# Patient Record
Sex: Female | Born: 1935 | Race: White | Hispanic: No | Smoking: Former smoker
Health system: Southern US, Community
[De-identification: ages and names within clinical notes are randomized; demographics above are authoritative.]

## PROBLEM LIST (undated history)

## (undated) DIAGNOSIS — F32A Depression, unspecified: Secondary | ICD-10-CM

## (undated) DIAGNOSIS — G20A1 Parkinson's disease without dyskinesia, without mention of fluctuations: Secondary | ICD-10-CM

## (undated) DIAGNOSIS — M1990 Unspecified osteoarthritis, unspecified site: Secondary | ICD-10-CM

## (undated) DIAGNOSIS — I10 Essential (primary) hypertension: Secondary | ICD-10-CM

## (undated) DIAGNOSIS — F0280 Dementia in other diseases classified elsewhere without behavioral disturbance: Secondary | ICD-10-CM

## (undated) DIAGNOSIS — G309 Alzheimer's disease, unspecified: Secondary | ICD-10-CM

## (undated) DIAGNOSIS — M109 Gout, unspecified: Secondary | ICD-10-CM

## (undated) DIAGNOSIS — E785 Hyperlipidemia, unspecified: Secondary | ICD-10-CM

## (undated) DIAGNOSIS — G629 Polyneuropathy, unspecified: Secondary | ICD-10-CM

## (undated) DIAGNOSIS — G4700 Insomnia, unspecified: Secondary | ICD-10-CM

## (undated) DIAGNOSIS — H9190 Unspecified hearing loss, unspecified ear: Secondary | ICD-10-CM

## (undated) DIAGNOSIS — G20 Parkinson's disease: Secondary | ICD-10-CM

## (undated) DIAGNOSIS — K5900 Constipation, unspecified: Secondary | ICD-10-CM

## (undated) DIAGNOSIS — F329 Major depressive disorder, single episode, unspecified: Secondary | ICD-10-CM

## (undated) DIAGNOSIS — M549 Dorsalgia, unspecified: Secondary | ICD-10-CM

## (undated) HISTORY — PX: TOTAL KNEE ARTHROPLASTY: SHX125

---

## 2002-07-31 ENCOUNTER — Encounter: Admission: RE | Admit: 2002-07-31 | Discharge: 2002-09-29 | Payer: Self-pay

## 2006-02-14 ENCOUNTER — Ambulatory Visit (HOSPITAL_COMMUNITY): Admission: RE | Admit: 2006-02-14 | Discharge: 2006-02-14 | Payer: Self-pay | Admitting: *Deleted

## 2006-04-29 ENCOUNTER — Inpatient Hospital Stay (HOSPITAL_COMMUNITY): Admission: EM | Admit: 2006-04-29 | Discharge: 2006-05-03 | Payer: Self-pay | Admitting: Emergency Medicine

## 2006-05-18 ENCOUNTER — Emergency Department (HOSPITAL_COMMUNITY): Admission: EM | Admit: 2006-05-18 | Discharge: 2006-05-18 | Payer: Self-pay | Admitting: Emergency Medicine

## 2006-05-30 ENCOUNTER — Inpatient Hospital Stay (HOSPITAL_COMMUNITY): Admission: RE | Admit: 2006-05-30 | Discharge: 2006-06-03 | Payer: Self-pay | Admitting: Neurological Surgery

## 2006-07-29 ENCOUNTER — Ambulatory Visit: Payer: Self-pay | Admitting: Gastroenterology

## 2006-08-28 ENCOUNTER — Ambulatory Visit (HOSPITAL_COMMUNITY): Admission: RE | Admit: 2006-08-28 | Discharge: 2006-08-28 | Payer: Self-pay | Admitting: Neurological Surgery

## 2006-10-13 ENCOUNTER — Emergency Department (HOSPITAL_COMMUNITY): Admission: EM | Admit: 2006-10-13 | Discharge: 2006-10-13 | Payer: Self-pay | Admitting: Emergency Medicine

## 2006-10-30 ENCOUNTER — Ambulatory Visit: Payer: Self-pay | Admitting: Gastroenterology

## 2007-04-21 ENCOUNTER — Emergency Department (HOSPITAL_COMMUNITY): Admission: EM | Admit: 2007-04-21 | Discharge: 2007-04-21 | Payer: Self-pay | Admitting: Emergency Medicine

## 2007-05-01 ENCOUNTER — Observation Stay (HOSPITAL_COMMUNITY): Admission: EM | Admit: 2007-05-01 | Discharge: 2007-05-07 | Payer: Self-pay | Admitting: Emergency Medicine

## 2007-05-10 ENCOUNTER — Emergency Department (HOSPITAL_COMMUNITY): Admission: EM | Admit: 2007-05-10 | Discharge: 2007-05-11 | Payer: Self-pay | Admitting: Emergency Medicine

## 2007-05-22 ENCOUNTER — Emergency Department (HOSPITAL_COMMUNITY): Admission: EM | Admit: 2007-05-22 | Discharge: 2007-05-22 | Payer: Self-pay | Admitting: Emergency Medicine

## 2007-09-17 ENCOUNTER — Emergency Department (HOSPITAL_COMMUNITY): Admission: EM | Admit: 2007-09-17 | Discharge: 2007-09-17 | Payer: Self-pay | Admitting: Emergency Medicine

## 2007-12-25 ENCOUNTER — Emergency Department (HOSPITAL_COMMUNITY): Admission: EM | Admit: 2007-12-25 | Discharge: 2007-12-25 | Payer: Self-pay | Admitting: Emergency Medicine

## 2008-04-06 ENCOUNTER — Encounter: Admission: RE | Admit: 2008-04-06 | Discharge: 2008-04-06 | Payer: Self-pay | Admitting: Internal Medicine

## 2008-04-08 ENCOUNTER — Ambulatory Visit (HOSPITAL_COMMUNITY): Admission: RE | Admit: 2008-04-08 | Discharge: 2008-04-08 | Payer: Self-pay | Admitting: Internal Medicine

## 2008-04-25 ENCOUNTER — Emergency Department (HOSPITAL_COMMUNITY): Admission: EM | Admit: 2008-04-25 | Discharge: 2008-04-25 | Payer: Self-pay | Admitting: Emergency Medicine

## 2008-05-16 ENCOUNTER — Emergency Department (HOSPITAL_COMMUNITY): Admission: EM | Admit: 2008-05-16 | Discharge: 2008-05-16 | Payer: Self-pay | Admitting: Emergency Medicine

## 2011-05-15 NOTE — Discharge Summary (Signed)
Jenna Barber, Jenna Barber                   ACCOUNT NO.:  [id number]   MEDICAL RECORD NO.:  [id number]          PATIENT TYPE:  INP   LOCATION:  6729                         FACILITY:  [facility]   PHYSICIAN:  Altha Harm, MDDATE OF BIRTH:  [birth year]   DATE OF ADMISSION:  04/30/2007  DATE OF DISCHARGE:  05/07/2007                               DISCHARGE SUMMARY   DISPOSITION:  [facility].   DISCHARGE DIAGNOSES:  1. Cogentin-induced delirium and psychosis, now resolved.  2. Constipation.  3. Chronic pain.  4. Parkinsonism.  5. Gait disturbance.  6. Acute sinusitis.  7. History of depression.  8. Hypokalemia.  9. Osteoporosis.  10.Gastroesophageal reflux disease.   DISCHARGE MEDICATIONS:  1. Cymbalta 60 mg p.o. daily.  2. Allegra D 60/120 one tablet p.o. b.i.d. x3 days.  3. Fentanyl 12 mcg transdermal q. 72 h.  4. Multivitamins 1 tablet p.o. daily.  5. Calcium 500 tablets p.o. b.i.d.  6. Protonix 40 mg p.o. daily.  7. MiraLax 17 g in 8 ounces of water p.o. daily.  8. Colace 100 mg p.o. daily.  9. Potassium 20 mEq p.o. daily.  10.Senokot 1 tablet p.o. daily.  11.Ultram 75 mg p.o. q. 8 h p.r.n. pain.  12.Vitamin D 400 mg p.o. daily.  13.Azithromycin 250 mg p.o. daily x4 days.   CONSULTATIONS:  1. Antonietta Breach, M.D., psychiatry.  2. Bevelyn Buckles. Nash Shearer, M.D., neurology.   PROCEDURES:  None.   DIAGNOSTIC STUDIES:  CT head without contrast shows no acute  intracranial abnormalities.  The patient has brain atrophy and chronic  small vessel disease.   CHIEF COMPLAINT:  Patient seeing things.   HISTORY OF PRESENT ILLNESS:  Please see dictated H&P by Dr. Lavera Guise for  details of the HPI.   HOSPITAL COURSE:  #1.  DELIRIUM:  The patient was admitted with delirium and continued to  have delirium albeit getting progressively better over the course of her  hospital stay.  By the end of her hospitalization, the patient's  delirium had resolved.  The delirium  was felt to be secondary to  Cogentin that had been prescribed for parkinsonism.  This was also  discontinued at the onset of her admission.  Neurology was consulted for  recommendations for a substitute medication.  It was felt at this time  there was not enough reason to warrant treating the patient for  parkinsonism.   #2.  GAIT DISTURBANCE:  The patient apparently had been ambulating prior  to admission to the hospital.  However upon evaluation during the  hospitalization, the patient was found to require maximum assistance  from 2 people for ambulation and transfers on a level surface.  Physical  therapy was working with the patient, and the patient is to continue  physical therapy at the skilled nursing facility.   #3.  CONSTIPATION:  The patient apparently has a history of chronic  constipation, and  during her hospitalization continued to have  constipation.  The patient was treated with her usual MiraLax, Senokot,  and a stool softener.  The patient is to continue  on such.   #4.  ACUTE SINUSITIS:  The patient complained of headache and had  frontal sinus tenderness.  No CT scan of the sinuses specifically done,  but the patient was treated empirically with a decongestant and  azithromycin for sinusitis.  The patient is to complete a course of  azithromycin 250 mg p.o. daily x4 days.   All other medical problems remained stable, and the patient is being  discharged on the above-stated medications.   CONDITION ON DISCHARGE:  Stable.  Dietary restrictions:  None.  Physical  restrictions:  The patient should be up with assistance and as directed  by physical therapy.  The patient is to follow up with her neurologist  in about 2-3 weeks for further evaluation at that time.      Altha Harm, MD  Electronically Signed     MAM/MEDQ  D:  05/06/2007  T:  05/06/2007  Job:  [id number]   cc:   Bertram Millard. Hyacinth Meeker, M.D.

## 2011-05-15 NOTE — Consult Note (Signed)
Jenna Barber, Jenna Barber                   ACCOUNT NO.:  [id number]   MEDICAL RECORD NO.:  [id number]          PATIENT TYPE:  INP   LOCATION:  6729                         FACILITY:  [facility]   PHYSICIAN:  Gustavus Messing. Orlin Hilding, M.D.DATE OF BIRTH:  1929/09/13   DATE OF CONSULTATION:  05/05/2007  DATE OF DISCHARGE:                                 CONSULTATION   REASON FOR CONSULTATION:  Movement disorder.  Recommend medication.   HISTORY OF PRESENT ILLNESS:  [name] is a 77 year old woman with a  history of severe cervical myelopathy who had been seen by Dr. Anne Hahn in  November of 2007 and again in April of 2008 for a gait disorder which he  felt was related to her cervical myelopathy and some tardive dyskinesias  related to withdrawal of metoclopramide.  It sounds from his note that  he was sent over with a question of Parkinson's.  He felt that there was  not evidence of Parkinson's, but that this was due to severe cervical  myelopathy.  He also made mention that she had a tardive dyskinesia upon  cessation of metoclopramide.  He had recommended when he saw her in  April a trial of Cogentin.  This was initiated after the April 08, 2007,  visit.  She was admitted on May 03, 2007 with what appeared to be a  psychosis with hallucinations and confusion.  She had both visual and  auditory hallucinations, and some agitation, which developed at her  residence which is an independent assisted level living at the [facility].  Since the Cogentin has been discontinued, she has been gradually  improving.  A CT scan of the brain was done on admission that was  negative for anything acute.  She has been seen by Dr. Jeanie Sewer of  psychiatry.   REVIEW OF SYSTEMS:  Out of a comprehensive 12-system review, including  cardiovascular, pulmonary, neurologic, hematologic, endocrine, GI, GU,  musculoskeletal, ENT, reproductive, skin, mucosa, pain, and nutrition,  earmarked as normal except for confusion and  neuropathy pain, impaired  memory, incontinence, some chronic right hip pain.   PAST MEDICAL HISTORY:  Significant for hypertension, migraines,  peripheral neuropathy, severe gait disturbance related to cervical  myelopathy.  Cervical spine surgery December of 2006.  Tonsillectomy,  bilateral knee replacement, lumbosacral spine surgery, left rotator cuff  surgery, osteoarthritis, appendectomy, some rotator cuff repairs, and  has had a splenectomy.   MEDICATIONS:  Prior to admission included Cogentin 1 mg twice a day, a  fentanyl patch, Cymbalta, glucosamine, calcium, Protonix, Miacalcin I  think, Colace, potassium, Senokot, tramadol, and vitamin D.  Currently,  her medicines include Lovenox, Protonix, Senokot, Duragesic patch,  Cymbalta, calcium carbonate, MiraLax, Tylenol, Dulcolax, Ultram, Haldol.   ALLERGIES LISTED:  INCLUDE NONSTEROIDALS, CYCLOOXYGENASE-2 OR COX-2  INHIBITORS, CLINDAMYCIN, CODEINE, METHADONE, PENICILLIN, SULFA,  TETRACYCLINE.   SOCIAL HISTORY:  She is divorced.  Retired Nature conservation officer.  Living  at [facility].  She has 2 children.  No alcohol or illegal drugs.   FAMILY HISTORY:  Not useful.   OBJECTIVE ON EXAM:  VITAL SIGNS:  Temperature is 98.2, pulse 80,  respirations 20, blood pressure 125/74, she is 95% sat on room air.  HEAD:  Normocephalic, atraumatic.  She is awake, alert, appropriate.  She scores 30 out of 30 on the mini mental status exam.  Cranial nerves  are normal.  She is partially edentulous with no upper teeth and partial  lower teeth.  She had some mouth movements which could be tardive  dyskinesia or Arbuckle dyskinesia versus a habit type of tic due to her  having jaw pain and being edentulous.  She appeared as though she was  exploring a sore tooth with her tongue.  However, cranial nerves are  normal.  Visual fields are full.  Extraocular movements are intact.  Facial sensation is normal.  Facial motor activity is normal.  Hearing   is symmetric.  Palate is symmetric.  Tongue is midline.  MOTOR EXAM:  I did not get her up.  She has a known gait disorder  presumed secondary to significant cervical myelopathy with multiple  falls.  However, she has no tremor.  She has mild increased tone  diffusely, but no cogwheeling.  She has good strength.  No drift.  Tendon reflexes are brisk in the upper and lower extremities with cross  conductors, but she has down-going toes.  Coordination and sensory are  unremarkable.  CT of the head was negative for anything acute.  Does  show atrophy and small vessel disease.   IMPRESSION:  History of tardive dyskinesia secondary to withdrawal from  metoclopramide.  No evidence of Parkinson's disease.  Minimal findings  on exam now.  She is intolerant to Cogentin, this apparently causes a  psychosis.   RECOMMENDATIONS:  Agree with discontinuation of Cogentin.  I do not see  enough of a movement disorder to treat at present.  She should follow up  with Dr. Anne Hahn in 3 to 4 weeks.      [name], M.D.  Electronically Signed     CAW/MEDQ  D:  05/05/2007  T:  05/05/2007  Job:  [id number]

## 2011-05-15 NOTE — Consult Note (Signed)
[name], Barber                   ACCOUNT NO.:  [id number]   MEDICAL RECORD NO.:  [id number]          PATIENT TYPE:  INP   LOCATION:  6729                         FACILITY:  [facility]   PHYSICIAN:  Antonietta Breach, M.D.  DATE OF BIRTH:  May 09, 1929   DATE OF CONSULTATION:  05/01/2007  DATE OF DISCHARGE:                                 CONSULTATION   REQUESTING PHYSICIAN:  Michelle A. Ashley Royalty, MD, of [facility].   REASON FOR CONSULTATION:  Hallucinations, bizarre behavior and  agitation.   [name] is a 78 year old female admitted to the [facility] on  April 30, 2007, after mental status changes.   Mrs. Jenna Barber has been having an onset of mental status changes over the  past day prior to admission.  She has been having auditory and visual  hallucinations.  She has been severely agitated as well as confused and  disoriented.   She required Haldol 5 mg at 3:30 this a.m.  Her agitation is less but  she does continue with hallucinations and mild confusion.   PAST PSYCHIATRIC HISTORY:  The patient does have a history of anxiety  and depression in the past.  She was treated with Lexapro in August  2007.  This was discontinued and she was started on Cymbalta in May  2007.   She has continued as an outpatient on Cymbalta 60 mg daily with good  results except for residual decreased energy.   FAMILY PSYCHIATRIC HISTORY:  None known.   SOCIAL HISTORY:  Marital status:  [name] is divorced.  Occupation:  Retired Scientist, clinical (histocompatibility and immunogenetics).  She does not use alcohol or illegal drugs.  She lives  at the [facility].  She has two children.  Her religion is  [place].   GENERAL MEDICAL PROBLEMS:  1. Degenerative spine disease.  2. Cervical surgery history.  3. Bilateral total knee replacements.  4. Rotator cuff repairs.  5. Status post splenectomy.  6. Peripheral neuropathy.   ALLERGIES:  NONSTEROIDAL ANTI-INFLAMMATORY DRUGS, CELEBREX, CLINDAMYCIN,  CODEINE, METHADONE, PENICILLIN, SULFA,  TETRACYCLINE.  Marland Kitchen   LABORATORY DATA:  WBC 6.5, hemoglobin 13, platelets 187.  BUN 15,  creatinine 0.79, SGOT 22, SGPT 14, albumin 3.3.  ammonia 19.  TSH within  normal limits with 1.188.  B12 within normal limits at 488.  RPR  nonreactive.  A head CT without contrast showed no acute abnormalities.  There is atrophy and chronic small-vessel disease.   MEDICATIONS:  MAR is reviewed.  The patient is on Cymbalta 60 mg daily,  Ultram 25 mg q.8h. p.r.n.   REVIEW OF SYSTEMS:  CONSTITUTIONAL:  Afebrile.  HEAD:  No trauma.  EYES:  No visual changes.  EARS:  No hearing impairment.  NOSE:  No rhinorrhea.  MOUTH/THROAT:  No sore throat.  NEUROLOGIC:  The the patient does have a  history of provisional parkinsonian tremor for which she was prescribed  Cogentin 1 mg b.i.d.  MUSCULOSKELETAL:  With the patient's severe  arthritis, she also has been taking fentanyl 12 mcg patch at the  [facility] as well as tramadol  p.r.n.  PSYCHIATRIC:  As above.  CARDIOVASCULAR:  No chest pain.  RESPIRATORY:  No cough.  GASTROINTESTINAL:  No nausea, vomiting, diarrhea.  GENITOURINARY:  No  dysuria.  SKIN:  Unremarkable.  MUSCULOSKELETAL:  As above.  HEMATOLOGIC/LYMPHATIC:  Unremarkable.  ENDOCRINE/METABOLIC:  Unremarkable.   EXAMINATION:  VITAL SIGNS:  Temperature 99.0, pulse 95, respirations 16,  blood pressure 141/80, O2 saturation on room air 95%.  MENTAL STATUS EXAM:  [name] is an elderly female partially  reclined in a supine position, appearing her chronologic age of 47 with  good eye contact.  She is well-groomed and cooperative with the  interview.  On orientation testing, she knows the year.  She also states  that the month is the fifth month.  It takes her awhile to remember the  name of the month.  She does not know the day of the month and she  states that she is at church.  Memory 3/3 immediate and 1/3 at 5  minutes.  Fund of knowledge and intelligence are below that of her  estimated  premorbid baseline.  Speech involves normal rate and prosody  without dysarthria.  Thought process involves partial confabulation.  Thought content:  Visual hallucinations, which the patient is amused  with.  No thoughts of harming herself, no thoughts of harming others.  No delusions.  Affect is involving a slightly inappropriate euphoric  affect at times.  However, the patient does have partial insight in that  she knows that she is having mental abnormalities.  Her judgment is  still impaired.   ASSESSMENT:  Axis I:  293.00, delirium not otherwise specified.  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  Other factors that  possibly are in play include a fentanyl patch at the assisted living  facility as well as possibly tramadol.  293.84, anxiety disorder not  otherwise specified, stable.  Depressive disorder not otherwise  specified, stable.  Axis II:  None.  Axis III:  See general medical problems.  Axis IV:  General medical.  Axis V:  40.   RECOMMENDATIONS:  1. Will check an EKG for the QTC.  If the QTC is less than 500 and the      hallucinations and confusion continue into the morning, would start      Haldol at 0.5 mg b.i.d. for antipsychosis and antiagitation.  2. Concur with the discontinuation of Cogentin.  Also concur with      reducing narcotics as much as possible.      Antonietta Breach, M.D.  Electronically Signed     JW/MEDQ  D:  05/01/2007  T:  05/02/2007  Job:  [id number]

## 2011-05-15 NOTE — H&P (Signed)
Jenna Barber, TISSUE                   ACCOUNT NO.:  [id number]   MEDICAL RECORD NO.:  [id number]          PATIENT TYPE:  INP   LOCATION:  6729                         FACILITY:  [facility]   PHYSICIAN:  Lonia Blood, M.D.       DATE OF BIRTH:  July 10, 1929   DATE OF ADMISSION:  05/01/2007  DATE OF DISCHARGE:                              HISTORY & PHYSICAL   PRIMARY CARE PHYSICIAN:  Bertram Millard. Hyacinth Meeker, M.D.   CHIEF COMPLAINTS:  Seeing things.   HISTORY OF PRESENT ILLNESS:  Mrs. Ramer is a 77 year old woman with a  history of osteoarthritis and spinal stenosis who has been residing at  the [facility].  The patient was sent today via ambulance to the  emergency room after she started acting weirdly, seeing things, hearing  voices and becoming agitated.  The patient, herself, right now does not  know where she is and does not know why she was brought here.  She would  just like to go home.   PAST MEDICAL HISTORY:  1. Degenerative spine disease.  2. Cervical surgery.  3. Bilateral total knee replacements.  4. Rotator cuff repairs.  5. Status post splenectomy.  6. Peripheral neuropathy.   MEDICATIONS AT THE ASSISTED LIVING FACILITY:  1. Cogentin 1 mg by mouth twice a day.  2. Fentanyl patch 12 mcg every 3 days.  3. Multivitamin daily.  4. Cymbalta 60 mg daily.  5. Glucosamine 500 mg daily.  6. Calcium 500 mg 2 tablets twice daily.  7. Protonix 40 mg daily.  8. MiraLax 17 grams daily.  9. Colace 100 mg daily.  10.Potassium 20 mEq daily.  11.Senokot one tablet daily.  12.Tramadol 75 mg as needed for pain.  13.Vitamin D 400 mg daily.   SOCIAL HISTORY:  The patient is a widow.  She has 2 grown children.  She  used to work as a Nature conservation officer.  She has a remote history of  tobacco use.  She does not drink any alcohol.  The patient is a do not  resuscitate status.   FAMILY HISTORY:  Reviewed and is noncontributory secondary to the  patient's age.   REVIEW OF SYSTEMS:  The patient  denies chest pain, denies shortness of  breath, denies nausea, vomiting or abdominal pain.   PHYSICAL EXAMINATION UPON ADMISSION:  VITAL SIGNS:  Temperature of 98.9,  blood pressure 139/80, pulse 80, respirations 16, saturation 98% on room  air.  GENERAL APPEARANCE:  Obese woman in no acute distress lying on the bed,  alert and somewhat oriented.  The patient has periods of extreme clarity  fluctuating with periods of utter confusion.  The patient is relating  seeing persons in the room that are not there and imagining things and  at the end of the hallway seeing things.  HEENT:  The head appears normocephalic and atraumatic.  Eyes - pupils  equal, round, reactive to light and accommodation.  Extraocular  movements intact.  CHEST:  Clear.  NECK:  Supple.  No JVD.  ABDOMEN:  Soft, nontender.  Bowel sounds are present.  LOWER EXTREMITIES:  No edema.  NEUROLOGIC:  Nonfocal.  The patient's short-term memory - out of 3  objects, she can repeat, and then at 3 minutes she can recall only 1 of  the 3 objects.   LABORATORY VALUES:  Lab values at the time of admission show a sodium of  138, potassium 3.9, chloride 108, BUN 18, creatinine 96, glucose is 96,  creatinine is 1.0.  White blood cell count 6.1, hemoglobin 13, platelet  count is 187.  Urinalysis is positive for some leukocyte esterase but  rare bacteria, white blood cells or red blood cells.   ASSESSMENT/PLAN:  1. Acute psychosis.  I do suspect this is related to the use of      Cogentin.  The patient will be taken off of Cogentin, and her      status will be closely monitored.  2. Inability to walk due to lumbar spine stenosis.  The patient will      continue physical therapy and occupational therapy here in the      hospital.      Lonia Blood, M.D.  Electronically Signed     SL/MEDQ  D:  05/01/2007  T:  05/01/2007  Job:  [id number]   cc:   Bertram Millard. Hyacinth Meeker, M.D.

## 2011-05-18 NOTE — Consult Note (Signed)
NAME:  Jenna Barber, Jenna Barber                             ACCOUNT NO.:  [id number]   MEDICAL RECORD NO.:  [id number]                   PATIENT TYPE:  REC   LOCATION:  TPC                                  FACILITY:  [facility]   PHYSICIAN:  Hans C. Stevphen Rochester, M.D.                DATE OF BIRTH:  Mar 19, 1929   DATE OF CONSULTATION:  DATE OF DISCHARGE:                           PAIN MANAGEMENT CONSULTATION   SUBJECTIVE:  Patient comes in for pain management today as a kind referral  from Dr. Marlynn Perking.  She is a pleasant 77 year old who has had declining  function indices and quality of life indices secondary to paralumbar  discomfort.  Notes I reviewed include an MRI report, and previous  interventional procedures performed by [facility] anesthesiology and pain.  Relating her pain as an 8/10 on a subjective scale, a dull ache and  throbbing, poor restorative sleep capacity and aching all over the  paralumbar region.  She was apparently also diagnosed with fibromyalgia but  probably more fibromyalgia in presentation as she does not have significant  suprascapular, perithoracic or paracervical pain component.  The injections  helped her.  They were fleeting in nature lasting up to only a month.  She  did not improve functional indices and quality of life indices after these  injections.  She relates no specific injury.  She relates her pain is  constant, aching and sharp.  Improved with rest, heat and medication.  Made  worse by walking, bending and sitting.  MRI reviewed which reveals  degenerative components in multiple levels with spondylolisthesis at 5 mm  L5/S1 and facet overlay.   MEDICATIONS:  Naproxen, spironolactone, hydrocodone, Risperdal, furosemide,  vitamin E, Centrum.  She was prescribed methadone but did not take it  secondary to dysphoria.   ALLERGIES:  Sulfa, penicillin, tetracycline, Codeine, clindamycin, Celebrex,  Vioxx, Voltaren and Mobic.   States no wish to harm self or others.   Fourteen point review of systems,  health and history form reviewed.   PAST SURGICAL HISTORY:  Total knee replacement in 1994 on the left side,  left shoulder rotator cuff repair, T&A at 40, appendectomy at 50 and Baker's  cyst in 1994.  She also relates heart disease with frequent PVCs.  Denies  hypertension, stroke, diabetes, cancer.   FAMILY HISTORY:  Remarkable for her heart disease, diabetes and  hypertension.  She is a retired Programmer, systems by trade.  She is a nonsmoker.  Nondrinker.  She is divorced.   Review of systems, family and social history otherwise noncontributory to  her pain problem.   PHYSICAL EXAMINATION:  GENERAL:  A pleasant female sitting comfortably in  bed.  Gait with __________ assistant device.  Affect, appearance is normal.  Oriented x 3.  HEENT:  Unremarkable.  CHEST:  Clear to auscultation and percussion.  HEART:  Regular rate and rhythm without murmur, rub or gallop.  ABDOMEN:  Soft.  Nontender.  Benign.  No hepatosplenomegaly.  MUSCULOSKELETAL:  She has some mild myofascial discomfort suprascapular and  perithoracic but most notably over PSIS with pain on extension.  Slightly  hyporeflexic at the patella.  Ankle jerk intact.  No clonus.  EHL intact,  firm and she has diffuse myofascial pain extending to the vastus lateralis.  She has a shuffle like ambulation without her assistant device.  She has no  other overt neurological deficit of motor, sensory or reflexes.   IMPRESSIONS:  Degenerative spinal disease, lumbar spine.  Sacral joint disease.  Myofascial pain syndrome.  Gout.  Fibromyalgia.   PLAN:  1. Referred to Dr. Isabell Jarvis for nerve conduction studies.  I also cannot rule     out early Parkinsonian as she has a shuffling gait, and some early     features she so describes.  I think this is somewhat of a reach, but I do     need a neurologist to help [place] with some input here.  2. Norco and Soma have helped her about as well as any other medication.   I     think it is fine the next couple of weeks to be giving her this     medication, but she understands the habituating nature of these     medications and to obtain them from only one dispensing source and     prescribing source.  She signs the patient care agreement with [place].  3. We will see her in follow up and consider further interventional     procedures if warranted.  I would like to see nerve conduction studies     and possibly review the imaging.  She is a possible neurotomy candidate     which may help her 25-50%, but I would like to rule out either a surgical     spinal axial condition or possibly a spinal axial condition that is     concomitant with central neurological disease prior to moving into that     arena.  Extensive consultation in this regard.  Discharge instructions     given.  Referred to Dr. Isabell Jarvis.                                               Hans C. Stevphen Rochester, M.D.    [facility]  D:  08/11/2002  T:  08/14/2002  Job:  [id number]   cc:   Isabell Jarvis, M.D.  [facility]

## 2011-05-18 NOTE — Consult Note (Signed)
NAMEKEYARRA, RENDALL                   ACCOUNT NO.:  [id number]   MEDICAL RECORD NO.:  [id number]          PATIENT TYPE:  INP   LOCATION:  5524                         FACILITY:  [facility]   PHYSICIAN:  Stefani Dama, M.D.  DATE OF BIRTH:  03-Jul-1929   DATE OF CONSULTATION:  05/01/2006  DATE OF DISCHARGE:  05/03/2006                                   CONSULTATION   REQUESTOR:  Dr. Cristal Deer Oti   REASON FOR CONSULTATION:  Low back pain, inability to walk.   HISTORY OF PRESENT ILLNESS:  [name] is a 77 year old lady with a  history of extensive degenerative disc disease involving the cervical and  lumbar spine.  She has undergone cervical surgery in November 2006 by Dr.  Cherlyn Cushing and was residing in an assisted living facility.  She notes that  she had significantly increasing weakness in her legs for a period of about  6 months and she notes that her ability to ambulate has been limited over  the past 6 months.  She was acutely admitted in the end of April, on the  29th, because of increasing difficulty with her ambulatory status.  She is  followed by Dr. Loleta Chance, a neurologist in [place], and he was performed  two nerve conduction studies on her which showed some mild abnormalities.  She furthermore had a myelogram performed by Dr. Loleta Chance and was referred to  Dr. Cherlyn Cushing for cervical surgery.  This was performed in November 2006.  We do not have any notes from those findings.  However, the patient gives a  fairly reliable history.  Prior to this dictation, my staff contacted Dr.  Adaline Sill office and we have verbal reports of cervical spondylytic myelopathy,  worse at the C5-C6 level.  The patient also had some modest lumbar  spondylytic degenerative changes.  EMGs and nerve conduction studies were  largely unremarkable.  However, Dr. Adaline Sill note suggests that the patient  has a severe peripheral neuropathy and the patient confirms this with her  history.   PAST MEDICAL  HISTORY:  Reveals that she had the cervical surgery in November  2006.  She has had bilateral total knee replacement.  She has rotator cuff  repair.  She has had appendectomy in the past, cardiac catheterization some  20 years ago.   CURRENT MEDICATIONS:  Include Estrace, vitamin E, hydrochlorothiazide,  glucosamine, Oxytrol patch for pain control, Lexapro 5 mg a day, oyster  calcium, potassium supplementation, Flexeril, and AcipHex.   She notes allergies to:  1.  SULFA.  2.  PENICILLIN.  3.  CODEINE.  4.  CELEBREX.  5.  VOLTAREN.  6.  CLINDAMYCIN.   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  She quit smoking some 20 years ago.  She has a daughter in [place].   SYSTEMS REVIEW:  Notable for headaches, nausea; otherwise, no real  complaints other than the significant bilateral lower extremity weakness.  She also notes that she has some chronic bladder incontinence.  She does not  have bowel incontinence.   PHYSICAL  EXAMINATION:  GENERAL:  Reveals that she is an obese elderly lady  who appears debilitated.  BACK:  Reveals that she has a midline scar which she tells me is from a  previous laminectomy performed several years ago on the right side.  Dr.  Manson Passey apparently did this surgery, did decompress and remove some bone  spurs.  NEUROLOGIC:  Her motor strength in the lower extremities reveal 4/5 strength  in the iliopsoas, the quadriceps, tibialis anterior, and the gastrocs.  Deep  tendon reflexes are absent in the patellae, absent in the Achilles.  Babinski's are downgoing.  Sensation is intact in distal lower extremity.  ABDOMEN:  Soft, protuberant.  Bowel sounds are positive.  No masses are  palpable.  MUSCULOSKELETAL:  Palpation and percussion across her lumbar spine did not  reproduce any acute tenderness.  Upper extremity strength and reflexes  appear normal with depressed reflexes in the biceps and triceps.  Axial  compression on her neck  does not reproduce any pain.   IMPRESSION:  The patient has evidence of spondylytic disease in the lower  lumbar spine by history.  She has had a progressive deterioration in her  ability to walk.  She has modest weakness in the lower extremities and it  may in fact be the case that she has severe peripheral neuropathy that may  not be amenable to any surgical intervention.  Will review upcoming studies  of the cervical, lumbar, and thoracic spine.  If there is a compressive  phenomenon will address this appropriately.  My suspicion is the patient  likely has a combination of some modest spondylitic changes with neuropathy  and surgical indications may be quite limited.      Stefani Dama, M.D.  Electronically Signed     HJE/MEDQ  D:  05/15/2006  T:  05/16/2006  Job:  [id number]

## 2011-05-18 NOTE — Discharge Summary (Signed)
Jenna Barber, Jenna Barber                   ACCOUNT NO.:  [id number]   MEDICAL RECORD NO.:  [id number]          PATIENT TYPE:  INP   LOCATION:  5028                         FACILITY:  [facility]   PHYSICIAN:  Stefani Dama, M.D.  DATE OF BIRTH:  February 11, 1929   DATE OF ADMISSION:  05/30/2006  DATE OF DISCHARGE:  06/03/2006                                 DISCHARGE SUMMARY   ADMISSION DIAGNOSIS:  Lumbar spinal stenosis with neurogenic claudication  and peripheral neuropathy.   DISCHARGE DIAGNOSES:  1.  Lumbar spinal stenosis with neurogenic claudication and peripheral      neuropathy.  2.  Paraparesis of lower extremities.   CONDITION ON DISCHARGE:  Improved.   HISTORY OF PRESENT ILLNESS:  Jenna Barber is a 77 year old individual who has  had significant problems with pain and bilateral lower extremity weakness.  She had evidence of significant degenerative changes in the lower lumbar  spine and has moderate to severe stenosis at the L3-L4 level.  The patient  has had other spondylitic disease in the cervical spine and has undergone  decompression of the cervical spine in October 2006, by Dr. Cherlyn Cushing.  She  has had previous lumbar decompression at the L4-L5 level a number of years  ago also by Dr. Manson Passey.  The patient has had progressive deterioration and  inability to walk.  She has been living in an assisted-living facility.  She  has been seen and worked up by Dr. Loleta Chance, neurologist in [place], and  has documented neuropathic changes on EMG and nerve conduction studies.  Because of the more recent deterioration and inability to ambulate, she was  seen in neurosurgical consultation 1-1/2 months ago when she was  hospitalized for her inability to ambulate.   PAST MEDICAL HISTORY:  1.  Peripheral neuropathy.  2.  Cervical spondylitic myelopathy.  3.  Bilateral total knee replacements.  4.  Rotator cuff repair in the arm, status post appendectomy.  5.  Cardiac catheterization for  evaluation of PVCs at [facility].   DISCHARGE MEDICATIONS:  1.  Senokot S nightly.  2.  Flexeril 10 mg t.i.d.  3.  Prednisone tapered to 0.  4.  Vicodin p.r.n. pain.  5.  Estrace 0.1% cream.  6.  Vitamin E.  7.  Glucosamine.  8.  Aciphex.  9.  Cymbalta 60 mg a day.  10. Lexapro, discontinued.   HOSPITAL COURSE:  The patient was admitted to the OR and had an X-stop  decompression performed at the L3-L4 level with this device.  Postoperatively, the patient has tolerated the surgery well.  However,  because of her  markedly debilitated state, it was felt that she will need significant  nursing care and she is being now transferred to a nursing facility to  receive this in addition to some rehabilitation.  If her ambulatory status  does improve, then she will hopefully be able to be returned to her assisted-  living facility.  Condition on discharge is stable.      Stefani Dama, M.D.  Electronically Signed     HJE/MEDQ  D:  06/03/2006  T:  06/03/2006  Job:  [id number]

## 2011-05-18 NOTE — Discharge Summary (Signed)
Jenna Barber, HOEY NO.:  [id number]   MEDICAL RECORD NO.:  [id number]          PATIENT TYPE:  INP   LOCATION:  5524                         FACILITY:  [facility]   PHYSICIAN:  Isidor Holts, M.D.  DATE OF BIRTH:  [birth year]   DATE OF ADMISSION:  04/28/2006  DATE OF DISCHARGE:  05/03/2006                                 DISCHARGE SUMMARY   ADDENDUM:  For discharge diagnoses, discharge medications, detailed clinical course,  admission history and procedures, refer to interim discharge summary  dictated May 01, 2006.  In addition, for subsequent two days, the patient's clinical condition  remained stable and steadily improved with physiotherapy.  There have been  no new issues.  Dr. Danielle Dess, neurosurgeon, has been consulted to evaluate the  patient's imaging studies, including an MRI of the thoracic spine dated May 03, 2006, which showed mild scoliosis of the thoracic spine but no evidence  of fracture or focal lesion, no stenosis or any cord abnormality.  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  Per Dr. Danielle Dess, there is no indication for active neurosurgical  intervention at this point, however, he has agreed to have the patient  follow up with him on an outpatient basis.  Appointment has therefore been  scheduled accordingly.   DISPOSITION:  The patient was considered sufficiently recovered, and  clinically stable to be discharged on May 03, 2006.  She is to continue  physiotherapy in the outpatient environment.  She has been arranged home  health aide, home health PT/OT and R.N.  A hospital bed has been arranged,  as well as bedside commode.  Apparently the patient already has a motorized  wheelchair.  Of note, the patient was evaluated by the rehabilitation team  during her hospital stay, and it was felt that the patient's level of  functioning is too high, for inpatient rehabilitation.  All of this has been  discussed with the patient, and she has verbalized understanding.      Isidor Holts, M.D.  Electronically Signed     CO/MEDQ  D:  05/03/2006  T:  05/03/2006  Job:  [id number]   cc:   Bertram Millard. Hyacinth Meeker, M.D.  Fax: [phone]   Stefani Dama, M.D.  Fax: [phone]

## 2011-05-18 NOTE — Assessment & Plan Note (Signed)
[facility]                           GASTROENTEROLOGY OFFICE NOTE   NAME:Primiano, BRYN SALINE                          MRN:          [id number]  DATE:10/30/2006                            DOB:          06-30-1929    PROBLEM:  Rectal fullness.   Mrs. Hernandes has returned for reevaluation.  She has the urge to defecate but  is unsuccessful.  She suffers from chronic constipation and is on a regimen  of Reglan, Colace, Senokot, and MiraLax.   EXAM:  ABDOMEN:  Without masses, tenderness, or organomegaly.  RECTAL:  She has a large solid stool in the anal area.   IMPRESSION:  Chronic constipation.   RECOMMENDATIONS:  To the current regimen, I would add Amitiza 24 mcg twice a  day as needed.     Barbette Hair. Arlyce Dice, MD,FACG  Electronically Signed    RDK/MedQ  DD: 10/30/2006  DT: 10/30/2006  Job #: [id number]   cc:   Bertram Millard. Hyacinth Meeker, M.D.

## 2011-05-18 NOTE — H&P (Signed)
NAMEERANDI, LEMMA NO.:  [id number]   MEDICAL RECORD NO.:  [id number]          PATIENT TYPE:  INP   LOCATION:  1825                         FACILITY:  [facility]   PHYSICIAN:  Hettie Holstein, D.O.    DATE OF BIRTH:  17-Oct-1929   DATE OF ADMISSION:  04/28/2006  DATE OF DISCHARGE:                                HISTORY & PHYSICAL   PRIMARY CARE PHYSICIAN:  [facility], Dr. Reggy Eye.   ORTHOPEDIC SURGEON:  Dr. __________  in [place].   NEUROSURGEON:  Dr. Gwendlyn Deutscher.  Fax number [phone].  Phone number [phone].   CHIEF COMPLAINT:  I can't walk.   HISTORY OF PRESENT ILLNESS:  Mrs. Jenna Barber is a 77 year old Caucasian female  with a history of extensive degenerative disk disease who has undergone  cervical surgery in November and currently residing in [id number]  assisted living who today attempted to ambulate and stated she could not  move her legs forward.  She suffers from severe peripheral neuropathy. She  does have a neurologist in [place] who is following her for this and  this is felt to be secondary to nerve compression from cervical disease  though it is not clear. She was seeing a pain doctor several years ago and  is on chronic pain medications.   On evaluation in the emergency department, she was unable to ambulate. She  was able to support her weight and stand.  She exhibited some weakness with  regard to her left lower extremity, especially in the plantar flexion though  predominant issue was coordinated gait with component of weakness.   PAST MEDICAL HISTORY:  1.  She has had cervical surgery in October.  2.  She has significant degenerative spine disease followed by a      neurosurgeon in [place].  3.  She has had bilateral total knee replacements by Dr. __________  .  4.  She has had rotator cuff repair on two layers arm.  5.  She is status post appendectomy.  6.  She reports having cardiac catheterization over  20 years ago.  7.  She reports cardiac catheterization evaluation being undertaken in      [place] and attributed her PVCs to medications.  I am currently      requesting records at this time.  8.  She has history of peripheral neuropathy.   MEDICATIONS:  1.  Estrace 0.1% cream vaginally every week.  2.  Vitamin E 400 IU once daily.  3.  Hydrochlorothiazide 25 mg daily.  4.  Glucosamine chondroitin one tablet daily.  5.  Oxytrol patch one patch twice weekly.  6.  Lexapro 5 mg daily.  7.  Oyster calcium 500 twice daily.  8.  Klor-Con 20 mEq twice daily.  9.  Flexeril 10 mg p.o. t.i.d.  10. Aciphex 20 mg as needed for gastroesophageal reflux disease.   ALLERGIES:  She has multiple drug allergies listed including:  1.  SULFA.  2.  PENICILLIN.  3.  CODEINE.  4.  CELEBREX.  5.  VOLTAREN.  6.  CLINDAMYCIN.   SOCIAL HISTORY:  The patient retired as med Best boy.  She quit smoking tobacco  over 20 years ago.  Her daughter, [place], is here with her now, can be  reached at [phone].   FAMILY HISTORY:  Noncontributory.   REVIEW OF SYSTEMS:  The patient has had some headache and nausea.  Otherwise  no other recent complaints.   PHYSICAL EXAMINATION:  VITAL SIGNS:  Vital signs were stable.  GENERAL:  The patient is alert, oriented x3, in no acute distress.  HEENT:  Normocephalic, atraumatic.  Extraocular muscles intact.  NECK:  Supple, nontender.  CARDIOVASCULAR:  Normal S1 and S2.  LUNGS:  Clear.  She exhibited normal effort.  There was no dullness to  percussion.  ABDOMEN:  Soft, nontender.  No palpable hepatosplenomegaly or mass.  She was  obese.  EXTREMITIES:  Tender to light touch with respect to her lower extremities.  She says this is not new.  NEUROLOGIC:  +4 strength upper extremities bilaterally, +4 proximally of  lower extremities, +4 on the right, +3 on the left with respect to dorsi and  plantar flexion.  Deep tendon reflexes were difficult to be elicited.  She  is  status post total knee replacements bilaterally and she screams in pain  when we touch her lower extremities.   LABORATORY DATA:  Her sodium is 131, potassium 3.8, BUN 14, creatinine 0.9,  glucose 97, __________  26/15.  Amylase 3.8.  WBC 4.6, hemoglobin 12.6,  platelet count 150, MCV 94.   ASSESSMENT:  1.  Ataxia.  2.  History of degenerative disk disease, status post cervical diskectomy      and multiple other surgeries including laminectomies.  Currently await      records from [facility].  3.  Chronic pain.  4.  Peripheral neuropathy, chronic.  5.  Multiple drug allergies.  6.  Hypokalemia, hyponatremia, presumably secondary to hydrochlorothiazide.   PLAN:  Going to order MRI, order PT/OT.  Obtain records from [facility], Dr. Manson Passey, __________  .  She does wish to obtain a neurologist  initial this area and does not wish to seek other neurosurgeons. She would  like to continue with Dr. Manson Passey.   Hettie Holstein, D.O.  Electronically Signed     ESS/MEDQ  D:  04/29/2006  T:  04/29/2006  Job:  [id number]   cc:   Bertram Millard. Hyacinth Meeker, M.D.  Fax: [phone]   Gwendlyn Deutscher, MD

## 2011-05-18 NOTE — Op Note (Signed)
NAMECYMONE, YESKE                   ACCOUNT NO.:  [id number]   MEDICAL RECORD NO.:  [id number]          PATIENT TYPE:  INP   LOCATION:  5028                         FACILITY:  [facility]   PHYSICIAN:  Stefani Dama, M.D.  DATE OF BIRTH:  [birth year]   DATE OF PROCEDURE:  05/30/2006  DATE OF DISCHARGE:                                 OPERATIVE REPORT   PREOPERATIVE DIAGNOSES:  1.  Lumbar stenosis, L3-L4.  2.  Lumbar neurogenic claudication.  3.  Peripheral neuropathy.   POSTOPERATIVE DIAGNOSES:  1.  Lumbar stenosis, L3-L4.  2.  Lumbar neurogenic claudication.  3.  Peripheral neuropathy.   PROCEDURES:  X-STOP decompression of L3-L4.   SURGEON:  Stefani Dama, M.D.   ANESTHESIA:  General endotracheal.   INDICATIONS:  Emri Sample is a 77 year old individual who has had significant  back and bilateral lower extremity pain and weakness.  The patient is noted  to have evidence of a peripheral neuropathy by EMG and nerve conduction  study; however, she has a moderately severe stenosis at the level of L3-L4.  The patient claims that any time she weight-bears, she has substantial  difficulty with increased back pain and increased leg pain.  She has  therefore given up ambulation to a large degree.  After careful  consideration of her options from a surgical standpoint, I advised that the  simplest decompression that the patient could undergo would be an X-STOP  decompression; however, the concern about the degree to which this would  help her underlying condition remains very guarded and nonetheless, the  patient is agreeable with proceeding with insertion of an X-STOP at the  worst level of stenosis; that is L3-L4.   PROCEDURE:  The patient was brought to the operating room supine on the  stretcher.  After a smooth induction of general endotracheal anesthesia, she  was turned prone and back was prepped with DuraPrep and draped in a sterile  fashion.  A midline incision was created and  carried down to the lumbodorsal  fascia, which was opened on either side of the midline to expose the  interlaminar space at L3-L4, which was localized positively with  fluoroscopy.  Then by stripping in a subperiosteal fashion the paraspinous  musculature and packing this away, a self-retaining retractor could be  placed deep in the wound.  The interlaminar space at the base of the spinous  process was identified at L3-L4 and confirmed radiographically.  Then by  placing a small awl through the interspinous ligament, a larger awl could  then be admitted.  Soft tissues in this area were cleared away and  ultimately the interspinous distractor could be placed into this site and  distraction was started and brought out to a total of 14 mm under a moderate  amount of tension of the posterior interspinous ligament.  The distraction  noted across the disk space was very remarkable.  It was decided to place a  12-mm X-STOP in this device.  After clearing some of the soft tissues and  allowing for approximately 5 minutes of ligamentotaxis,  the interspinous  distractor was removed and a 12-mm X-STOP was placed into the void.  The  lateral wing was applied.  The system was torqued down properly and final  localizing radiographs were obtained in the AP and lateral projections.  These identified good position of the X-STOP spacer in the L3-L4  interspinous space.  With this, the wound was copiously irrigated with  antibiotic irrigating solution.  The lumbodorsal fascia was closed with 2-0  Vicryl in interrupted fashion, 3-0 Vicryl was used to close the subcuticular  skin and Dermabond was applied to the skin.  A total of 40 mL of local 1%  lidocaine with 1:100,000 epinephrine with 0.5% Marcaine mix 50/50 were  injected into the surrounding soft tissues at the start of the procedure.      Stefani Dama, M.D.  Electronically Signed     HJE/MEDQ  D:  05/30/2006  T:  05/31/2006  Job:  [id number]

## 2011-05-18 NOTE — Assessment & Plan Note (Signed)
[facility]                           GASTROENTEROLOGY OFFICE NOTE   NAME:Barber, Jenna LOVEALL                          MRN:          [id number]  DATE:07/29/2006                            DOB:          1929-11-25    OFFICE CONSULTATION NOTE:   REFERRING PHYSICIAN:  Bertram Millard. Hyacinth Meeker, MD   PROBLEM:  Hemorrhoids and rectal discomfort.   HISTORY:  Jenna Barber is a pleasant 77 year old white female with multiple medical  problems.  She is currently a resident of [facility] and has  problems with lumbar spinal stenosis with neurogenic claudication, also with  cervical spondylitic myelopathy.  She has had bilateral total knee  replacements and a rotator cuff repair on the left.  She is status post  appendectomy and has history of PVCs.   The patient reports a previous colonoscopy done in 2005 by Dr. Doristine Church in  [place] and was told that she had a couple of small benign polyps.  She says with all of her recent health problems and inactivity, she  developed a fecal impaction in June 2007.  This was treated and has since  been on daily Miralax as well as Senokot.  She says with that regimen she is  having a soft bowel movement on a daily basis.  She says since that time she  has had some rectal discomfort and has had an intermittent protrusion from  her rectum, and she had never previously had trouble with hemorrhoids.  She  has not had any melena or hematochezia, denies any abdominal pain.  Her  appetite has been okay.  Her weight apparently is down about 5 pounds.  She  does have some chronic GERD symptoms, is on Protonix, and feels that this is  quite helpful.  The patient had been treated with a short course of Anusol-  HC for approximately 5 days, which was helpful, but says that she still has  a sore, rough feeling in her rectum.   CURRENT MEDICATIONS:  Multiple, and include:   1.  Oxybutynin ER 5 mg daily.  2.  Glucosamine supplement.  3.  Lexapro 10 mg daily.  4.  Protonix 40 mg daily.  5.  Senokot q.h.s.  6.  Miralax 17 g daily in water.  7.  Colace twice daily.  8.  Reglan 10 mg t.i.d.  9.  Potassium chloride 20 mEq daily.  10. Ultram p.r.n.  11. Hydrocodone p.r.n.   ALLERGIES:  Unknown.   PAST HISTORY:  As outlined above.   SOCIAL HISTORY:  The patient is currently wheelchair-bound and a nursing  home resident.  Unable to ambulate at this time.  Nonsmoker, nondrinker.   FAMILY HISTORY:  Negative for colon cancer or polyps.   REVIEW OF SYSTEMS:  Pertinent for arthritic symptoms, back pain, urinary  incontinence and some chronic right hip pain.  GASTROINTESTINAL:  As  outlined above.   PHYSICAL EXAMINATION:  GENERAL:  A well-developed, elderly, debilitated  white female in a wheelchair.  She had to be lifted to the examining table.  Is unable to ambulate.  VITAL  SIGNS:  Height is 5 feet 7 inches.  Weight is 182.5.  Blood pressure  124/78, pulse is 70.  CARDIOVASCULAR:  Regular rate and rhythm with S1 and S2.  PULMONARY:  Clear to A&P.  ABDOMEN:  Soft, bowel sounds are active.  She is nontender.  There is no  palpable mass or hepatosplenomegaly.  No guarding or rebound.  RECTAL:  Heme-negative.  No stool in the rectal vault.  No mass or  impaction.  No rectal prolapse or prolapsing hemorrhoids noted at this time.   IMPRESSION:  A 77 year old white female with recent impaction, now with  rectal discomfort most consistent with symptomatic internal hemorrhoids.  Given negative colonoscopy in 2005, feel the likelihood of an occult lesion  is low and do not feel that she needs repeat colonoscopy at this time and  therefore will treat symptomatically.   PLAN:  1.  Continue current bowel regimen with stool softeners, daily Miralax and      daily Senokot, as this seems to be working well for her.  2.  Trial of __________  2% cream twice daily x2 weeks and then p.r.n.      thereafter.  3.  Return office visit  on a p.r.n. basis.                                   Amy Esterwood, PA-C                                Robert D. Arlyce Dice, MD, [facility]   AE/MedQ  DD:  07/29/2006  DT:  07/30/2006  Job #:  [id number]   cc:   Bertram Millard. Hyacinth Meeker, M.D.

## 2011-05-18 NOTE — Discharge Summary (Signed)
NAMEVIHANA, KYDD                   ACCOUNT NO.:  [id number]   MEDICAL RECORD NO.:  [id number]          PATIENT TYPE:  INP   LOCATION:  5524                         FACILITY:  [facility]   PHYSICIAN:  Isidor Holts, M.D.  DATE OF BIRTH:  1929-03-23   DATE OF ADMISSION:  04/28/2006  DATE OF DISCHARGE:                                 DISCHARGE SUMMARY   DATE OF DISCHARGE:  To be determined.   PRIMARY CARE PHYSICIAN:  Dr. Jacalyn Lefevre of [facility].   DISCHARGE DIAGNOSES:  1.  Acute weak legs, multifactorial.  2.  Severe lumbosacral degenerative joint disease/Spinal      stenosis/Radiculopathy.  3.  History of peripheral neuropathy.  4.  Chronic pain syndrome.  5.  History of osteoarthritis, status post bilateral knee replacements.  6.  Status post cervical diskectomy, November 01, 2005.   DISCHARGE MEDICATIONS:  1.  Senokot-S one p.o. q.h.s.  2.  Oxytrol patch one topically every month.  3.  Flexeril 10 mg p.o. t.i.d.  4.  Prednisone 40 mg p.o. daily for three days; then 30 mg p.o. daily for      three days; then 20 mg p.o. daily for three days; then 10 mg p.o. daily      for three days; then stop.  5.  Vicodin (5/500) one p.o. p.r.n. q.4h.  6.  Estrace 0.1% cream vaginally weekly.  7.  Vitamin E 400 international units p.o. daily.  8.  Glucosamine, chondroitin one tablet daily.  9.  AcipHex 20 mg daily.  10. Cymbalta 60 mg p.o. daily.  11. Lexapro (this has been discontinued).   PROCEDURE:  MRI of lumbosacral spine, dated April 29, 2006, showed grade I  anterolisthesis of L5 through S1 with mild to moderate biforaminal narrowing  and also narrowing of the posterior column due to ligamentum flavum  thickening at L4-5. Moderate to severe central canal stenosis at L3-4.  Foraminal narrowing is worse, right than left. Moderate central canal  stenosis at L2-3.   CONSULTATIONS:  Dr. Stefani Dama, neurosurgery.   ADMISSION HISTORY:  As in H&P notes of April 28, 2006,  dictated by Dr. Hannah Beat. However, in brief, this is a 77 year old female, with known history  of multi-level DJD, status post cervical diskectomy on November 01, 2005, by  Dr. Manson Passey, neurosurgeon, at [facility] in [facility], history of  osteoarthritis, status post bilateral total knee replacements, peripheral  neuropathy, who presents with inability to walk of several days duration.  She was admitted for further evaluation, investigation, and management.   CLINICAL COURSE:  1.  Bilateral weak legs: This is a patient with known history of multilevel      DJD, also peripheral neuropathy, presenting with relatively short      duration of inability to ambulate. Power in both lower extremities was      at least 3+ to 4, with depressed deep tendon reflexes. The patient also      had increased sensitivity to palpation, in both lower extremities. A      lumbosacral MRI was done,  which demonstrated significant spinal      stenosis, L3-4 and L2-3. It was felt that this was the likely culprit,      with associated radiculopathy against the background of peripheral      neuropathy. Definitive management will be under the purview of      neurosurgeons, but on initial discussion with the patient she indicated      that she would like to stick with her neurosurgeon, i.e., Dr. Manson Passey, in      [facility]. A neurosurgical consultation was therefore not called      initially, the anticipation being that the patient could follow up with      Dr. Manson Passey following discharge. She was managed with physical      therapy/occupational therapy and tapering course of steroid, with good      clinical effect. She was subsequently by May 01, 2006, able to ambulate      with a four-legged walker. Recommendations are for continuing PT/OT on      an outpatient basis. However, on May 01, 2006, the patient indicated to      this MD, that she would like a neurosurgeon locally, as she will be      unable to meet  her appointments in [facility]. We have therefore      requested neurosurgical consultation, which will be provided by Dr.      Danielle Dess, and will manage per his recommendations.   1.  Peripheral neuropathy: As mentioned above, the patient experienced      hypersensitivity in both lower extremities, likely associated with a      peripheral neuropathy. Per my discussions with the patient, it appears      that her primary MD, Dr. Hyacinth Meeker, has been planning to switch her to      Cymbalta. We have therefore discontinued her Lexapro and following      discussion with Dr. Hyacinth Meeker, will likely commence the patient on      Cymbalta. Essentially, TSH, B12, and folate levels were all within      normal limits. Also, CK was normal at 54.   1.  Electrolyte abnormalities: At the time of presentation the patient's      sodium was somewhat low at 131. This was thought likely secondary to pre-      admission hydrochlorothiazide, which was therefore, discontinued, and      electrolyte abnormalities resolved.   DISPOSITION:  It is anticipated that the patient will be discharged in the  next day or two, to her assisted living facility, to continue outpatient  physical therapy and occupational therapy. Follow up appointment will be  arranged with neurosurgeon. However, the patient has expressed reservations  that she will be unable to cope in the assisted living environment, and has  requested a higher level of care, possibly in a short-term skilled nursing  facility. This is being explored and final disposition will be made at the  time appropriate placement is determined.   DIET:  Regular.   ACTIVITY:  As tolerated.   WOUND CARE:  Not applicable.   PAIN MANAGEMENT:  Refer to discharge medication list.   FOLLOWUP INSTRUCTIONS:  The patient is expected to follow up with her PMD, Dr. Jacalyn Lefevre, per prior scheduled appointment and follow up with  neurosurgeon, i.e., Dr. Danielle Dess, at a date to be  determined.      Isidor Holts, M.D.  Electronically Signed     CO/MEDQ  D:  05/01/2006  T:  05/01/2006  Job:  [id number]   cc:   Bertram Millard. Hyacinth Meeker, M.D.  Fax: [phone]   Gwendlyn Deutscher, M.D.  [facility], [place]

## 2011-05-18 NOTE — H&P (Signed)
NAMEMEGGEN, SPAZIANI                   ACCOUNT NO.:  [id number]   MEDICAL RECORD NO.:  [id number]          PATIENT TYPE:  INP   LOCATION:  3172                         FACILITY:  [facility]   PHYSICIAN:  Stefani Dama, M.D.  DATE OF BIRTH:  [birth year]   DATE OF ADMISSION:  05/30/2006  DATE OF DISCHARGE:                                HISTORY & PHYSICAL   ADMISSION DIAGNOSES:  1.  Lumbar spinal stenosis L3-L4.  2.  Neurogenic claudication.  3.  Peripheral neuropathy.   HISTORY OF PRESENT ILLNESS:  [name] is a 77 year old individual who  has had significant back pain, bilateral lower extremity weakness.  She has  evidence of severe degenerative changes in the lower lumbar spine and has an  area of moderate stenosis at the level of L3-L4.  She has seen and followed  by a neurologist in [place], and has had previous anterior cervical  decompression surgery by Dr. Gwendlyn Deutscher.  She underwent a myelogram some  time ago by Dr. Loleta Chance, and this apparently demonstrates that the patient has  moderate spinal canal stenosis of the level of L3-L4.  An MRI of her lumbar  spine was performed during a hospitalization on April 29,2007, and this  study was reviewed and demonstrates that she has a moderate degree of  stenosis at the L3-L4 level.  Because of the patient's history of peripheral  neuropathy, her picture has been rather confusing.  She does have a moderate  degree of weakness in the lower extremities.  She has been walking only 1-2  steps with assistance and has a difficult time weightbearing.  She has  evidence of diffuse weakness in the lower extremities including the  iliopsoas quad, tibialis anterior, and the gastrocs.  After careful  consideration of her options, I advised regarding an x-stop decompression.  The degree to which this may help has also been discussed with the patient.  The possibility that it may help only a little or not at all has been  clearly made evident to  the patient.  Nevertheless, at this point, she has  little other options for any treatment or intervention.  She is being  admitted to the hospital to undergo x-stop  procedure.   PAST MEDICAL HISTORY:  1.  She has history of peripheral neuropathy.  2.  She has a history of having had cervical spondylitic myelopathy with      decompression in October2006.  3.  She had bilateral total knee replacements.  4.  She had a rotator cuff repair in the arm.  5.  She is status post an appendectomy.  6.  She has had a cardiac catheterization for an evaluation for PVCs by      cardiologists at [facility].  7.  She has history of peripheral neuropathy.   MEDICATIONS AT TIME OF HER LAST DISCHARGE FROM THE HOSPITAL:  1.  Senokot S q.h.s.  2.  Oxytrol patch.  3.  Flexeril 10 mg t.i.d.  4.  Prednisone 40 mg daily x3 days, 30 mg daily x3 days, and then 20  mg      daily x3 days, 10 mg p.o. daily x3 3 days then stop.  5.  Vicodin 5/500 p.o. p.r.n. q.4 h.  6.  Estrace 0.1% cream vaginally.  7.  Vitamin E 400 International units p.o. daily.  8.  Glucosamine chondroitin 1 tablet daily.  9.  AcipHex 20 mg daily.  10. Cymbalta 60 mg daily.  11. Lexapro which has been discontinued.   SOCIAL HISTORY:  The patient lives in the assisted living, called the  [facility] on [address].  She is becoming increasingly debilitated and  at the current time, tells me she only walks 1-2 steps with assistance.  Most of the other time, she spends in the wheelchair.   PHYSICAL EXAMINATION:  Physical exam at this time reveals that she has good  power to confrontational testing in the lower extremities, all graded at 4/5  in iliopsoas, quadriceps, tibialis, anterior, and the gastrocs.  Deep tendon  reflexes are absent in the patellae, absent in the Achilles'.  Babinski's  are equivocal bilaterally.  Sensation appears diminished at the ankle to the  level of the knees, to pin, light touch, and vibration  bilaterally.  Rectal  tone was not tested on this admission.  Her deep tendon reflexes in the  upper extremities are trace in the biceps, absent in the triceps, absent in  the brachial radialis.  Sensation appears intact in the distal upper  extremities.  Neck reveals no masses; no bruits are heard.  Range of motion  in the neck reveals that she turns 30 degrees to the left, 30 degrees to  right.  She can flex and extend about 50% of normal.  Axial compression does  not reproduce any pain.   GENERAL PHYSICAL EXAM:  LUNGS:  Clear to auscultation.  HEART:  Regular rate and rhythm.  No murmurs are heard.  ABDOMEN:  Soft, protuberant.  Bowel sounds are positive.  No masses are  noted.  EXTREMITIES:  No no cyanosis, clubbing, or edema.   IMPRESSION:  The patient has evidence of spondylosis with worsening of level  of L3-L4.  She has some area of moderate severity of stenosis.  Given her  history of peripheral neuropathy, I specifically mentioned to the patient  that no guarantees could be made whether decompression via an  x-stop  procedure would alleviate any or some of her pain.  The weakness may be made  better, but there is no guarantee that can be made about this either.  At  this time, she is being admitted to undergo an procedure at the L3-L4 level.      Stefani Dama, M.D.  Electronically Signed     HJE/MEDQ  D:  05/30/2006  T:  05/30/2006  Job:  [id number]

## 2011-09-25 LAB — URINALYSIS, ROUTINE W REFLEX MICROSCOPIC
Glucose, UA: NEGATIVE
Ketones, ur: NEGATIVE
Protein, ur: 100 — AB
pH: 7

## 2011-09-25 LAB — BASIC METABOLIC PANEL
BUN: 14
Chloride: 105
GFR calc Af Amer: >60
GFR calc non Af Amer: 59 — ABNORMAL LOW
Potassium: 3.9
Sodium: 140

## 2011-09-25 LAB — DIFFERENTIAL
Eosinophils Absolute: 0.2
Eosinophils Relative: 2
Lymphocytes Relative: 8 — ABNORMAL LOW
Lymphs Abs: 0.6 — ABNORMAL LOW
Monocytes Absolute: 0.3
Monocytes Relative: 4

## 2011-09-25 LAB — URINE CULTURE

## 2011-09-25 LAB — CBC
HCT: 34.9 — ABNORMAL LOW
Hemoglobin: 11.9 — ABNORMAL LOW
MCV: 94
RBC: 3.72 — ABNORMAL LOW
WBC: 7.2

## 2011-09-25 LAB — URINE MICROSCOPIC-ADD ON

## 2011-10-05 LAB — URINALYSIS, ROUTINE W REFLEX MICROSCOPIC
Bilirubin Urine: NEGATIVE
Glucose, UA: NEGATIVE
Hgb urine dipstick: NEGATIVE
Protein, ur: NEGATIVE
Specific Gravity, Urine: 1.02

## 2011-10-11 LAB — I-STAT 8, (EC8 V) (CONVERTED LAB)
Acid-Base Excess: 2
Chloride: 105
HCT: 40
Hemoglobin: 13.6
Operator id: 288331
Potassium: 4
Sodium: 139
TCO2: 31
pH, Ven: 7.328 — ABNORMAL HIGH

## 2011-10-11 LAB — DIFFERENTIAL
Basophils Relative: 1
Eosinophils Absolute: 0.2
Eosinophils Relative: 4
Lymphs Abs: 1.2
Monocytes Absolute: 0.5
Monocytes Relative: 9

## 2011-10-11 LAB — CBC
HCT: 36.9
MCHC: 33.7
MCV: 94.3
RBC: 3.91
WBC: 5.7

## 2011-10-11 LAB — POCT I-STAT CREATININE
Creatinine, Ser: 1
Operator id: 288331

## 2013-03-19 ENCOUNTER — Non-Acute Institutional Stay (SKILLED_NURSING_FACILITY): Payer: Medicare Other | Admitting: Adult Health

## 2013-03-19 ENCOUNTER — Encounter: Payer: Self-pay | Admitting: Adult Health

## 2013-03-19 DIAGNOSIS — Z7901 Long term (current) use of anticoagulants: Secondary | ICD-10-CM | POA: Insufficient documentation

## 2013-03-19 DIAGNOSIS — I82402 Acute embolism and thrombosis of unspecified deep veins of left lower extremity: Secondary | ICD-10-CM

## 2013-03-19 NOTE — Progress Notes (Signed)
  Subjective:    Patient ID: Jenna Barber, female    DOB: 1929-02-14, 79 y.o.   MRN: [id number]  HPI This is an   79 -year-old female who has INR  3.0 - therapeutic. INR 7 days ago (03/10/13) was 2.3 - therapeutic. No complaints of shortness of breath nor chest pain. She is on chronic Coumadin therapy due to history of LLE DVT.   Review of Systems  Constitutional: Negative.   HENT: Negative.   Eyes: Negative.   Respiratory: Negative for chest tightness and shortness of breath.   Cardiovascular: Positive for leg swelling.  Neurological: Negative.   Hematological: Negative for adenopathy. Does not bruise/bleed easily.  Psychiatric/Behavioral: Negative.        Objective:   Physical Exam  Constitutional: She appears well-developed.  HENT:  Head: Normocephalic.  Right Ear: External ear normal.  Left Ear: External ear normal.  Eyes: Conjunctivae are normal. Pupils are equal, round, and reactive to light.  Neck: Normal range of motion. Neck supple.  Cardiovascular: Normal rate, regular rhythm and normal heart sounds.   Pulmonary/Chest: Effort normal and breath sounds normal.  Abdominal: Soft. Bowel sounds are normal.  Musculoskeletal: She exhibits edema.  BLE edema, 2+  Neurological: She is alert.  Skin: Skin is warm and dry.  Psychiatric: She has a normal mood and affect.          Assessment & Plan:  Anticoagulant, Long Term Use  - continue Coumadin 2.5 mg PO Q D and repeat INR on 03/24/13

## 2013-03-24 ENCOUNTER — Encounter: Payer: Self-pay | Admitting: Adult Health

## 2013-03-24 ENCOUNTER — Non-Acute Institutional Stay (SKILLED_NURSING_FACILITY): Payer: Medicare Other | Admitting: Adult Health

## 2013-03-24 DIAGNOSIS — Z7901 Long term (current) use of anticoagulants: Secondary | ICD-10-CM

## 2013-03-24 DIAGNOSIS — I82402 Acute embolism and thrombosis of unspecified deep veins of left lower extremity: Secondary | ICD-10-CM

## 2013-03-24 DIAGNOSIS — I82409 Acute embolism and thrombosis of unspecified deep veins of unspecified lower extremity: Secondary | ICD-10-CM

## 2013-03-24 NOTE — Progress Notes (Signed)
  Subjective:    Patient ID: Jenna Barber, female    DOB: 22-May-1929, 79 y.o.   MRN: [id number]  HPI  This is an   79 -year-old female who has INR  3.3 - supra therapeutic. INR 7 days ago (03/17/13) was 3.0- therapeutic. No bruising nor bleeding. She is on chronic Coumadin therapy due to history of LLE DVT.   Review of Systems  Constitutional: Negative.   HENT: Negative.   Eyes: Negative.   Respiratory: Negative for chest tightness and shortness of breath.   Cardiovascular: Positive for leg swelling.  Neurological: Negative.   Hematological: Negative for adenopathy. Does not bruise/bleed easily.  Psychiatric/Behavioral: Negative.        Objective:   Physical Exam  Constitutional: She appears well-developed.  HENT:  Head: Normocephalic.  Right Ear: External ear normal.  Left Ear: External ear normal.  Eyes: Conjunctivae are normal. Pupils are equal, round, and reactive to light.  Neck: Normal range of motion. Neck supple. No tracheal deviation present. No thyromegaly present.  Cardiovascular: Normal rate, regular rhythm and normal heart sounds.   Pulmonary/Chest: Effort normal and breath sounds normal.  Abdominal: Soft. Bowel sounds are normal. She exhibits no distension. There is no tenderness.  Musculoskeletal: She exhibits edema.  BLE edema, 2+  Lymphadenopathy:    She has no cervical adenopathy.  Neurological: She is alert.  Skin: Skin is warm and dry. No erythema.  Psychiatric: She has a normal mood and affect. Her behavior is normal. Thought content normal.          Assessment & Plan:  Anticoagulant, Long Term Use  - decrease Coumadin 2 mg PO Q D and repeat INR on 03/27/13 Patient Active Problem List  DVT (deep venous thrombosis) - stable.

## 2013-03-27 ENCOUNTER — Non-Acute Institutional Stay (SKILLED_NURSING_FACILITY): Payer: Medicare Other | Admitting: Adult Health

## 2013-03-27 DIAGNOSIS — I82409 Acute embolism and thrombosis of unspecified deep veins of unspecified lower extremity: Secondary | ICD-10-CM

## 2013-03-27 DIAGNOSIS — I82402 Acute embolism and thrombosis of unspecified deep veins of left lower extremity: Secondary | ICD-10-CM

## 2013-03-27 DIAGNOSIS — Z7901 Long term (current) use of anticoagulants: Secondary | ICD-10-CM

## 2013-03-30 ENCOUNTER — Encounter: Payer: Self-pay | Admitting: Adult Health

## 2013-03-30 NOTE — Progress Notes (Signed)
Patient ID: Jenna Barber, female   DOB: July 04, 1929, 79 y.o.   MRN: [id number] Subjective:     Indication: DVT Bleeding signs/symptoms: None Thromboembolic signs/symptoms: None  Missed Coumadin doses: None Medication changes: no Dietary changes: no Bacterial/viral infection: no Other concerns: no   Review of Systems A comprehensive review of systems was negative.   Objective:    INR Today:3.3 Current dose: 2 mg  Assessment:    Supratherapeutic INR for goal of  2-3.5  Plan:    1. New dose: decrease Coumadin to 1.5 mg PO Q D   2. Next INR:  03/31/13

## 2013-03-31 ENCOUNTER — Non-Acute Institutional Stay (SKILLED_NURSING_FACILITY): Payer: Medicare Other | Admitting: Adult Health

## 2013-03-31 DIAGNOSIS — Z7901 Long term (current) use of anticoagulants: Secondary | ICD-10-CM

## 2013-03-31 DIAGNOSIS — I82409 Acute embolism and thrombosis of unspecified deep veins of unspecified lower extremity: Secondary | ICD-10-CM

## 2013-03-31 DIAGNOSIS — I82402 Acute embolism and thrombosis of unspecified deep veins of left lower extremity: Secondary | ICD-10-CM

## 2013-04-04 ENCOUNTER — Non-Acute Institutional Stay (SKILLED_NURSING_FACILITY): Payer: Medicare Other | Admitting: Adult Health

## 2013-04-04 DIAGNOSIS — I82402 Acute embolism and thrombosis of unspecified deep veins of left lower extremity: Secondary | ICD-10-CM

## 2013-04-04 DIAGNOSIS — I82409 Acute embolism and thrombosis of unspecified deep veins of unspecified lower extremity: Secondary | ICD-10-CM

## 2013-04-04 DIAGNOSIS — Z7901 Long term (current) use of anticoagulants: Secondary | ICD-10-CM

## 2013-04-07 ENCOUNTER — Non-Acute Institutional Stay (SKILLED_NURSING_FACILITY): Payer: Medicare Other | Admitting: Adult Health

## 2013-04-07 DIAGNOSIS — Z7901 Long term (current) use of anticoagulants: Secondary | ICD-10-CM

## 2013-04-07 DIAGNOSIS — I82402 Acute embolism and thrombosis of unspecified deep veins of left lower extremity: Secondary | ICD-10-CM

## 2013-04-07 DIAGNOSIS — I82409 Acute embolism and thrombosis of unspecified deep veins of unspecified lower extremity: Secondary | ICD-10-CM

## 2013-04-11 ENCOUNTER — Non-Acute Institutional Stay (SKILLED_NURSING_FACILITY): Payer: Medicare Other | Admitting: Adult Health

## 2013-04-11 DIAGNOSIS — G589 Mononeuropathy, unspecified: Secondary | ICD-10-CM

## 2013-04-11 DIAGNOSIS — I82402 Acute embolism and thrombosis of unspecified deep veins of left lower extremity: Secondary | ICD-10-CM

## 2013-04-11 DIAGNOSIS — K5900 Constipation, unspecified: Secondary | ICD-10-CM

## 2013-04-11 DIAGNOSIS — Z7901 Long term (current) use of anticoagulants: Secondary | ICD-10-CM

## 2013-04-11 DIAGNOSIS — M1990 Unspecified osteoarthritis, unspecified site: Secondary | ICD-10-CM

## 2013-04-11 DIAGNOSIS — G309 Alzheimer's disease, unspecified: Secondary | ICD-10-CM

## 2013-04-11 DIAGNOSIS — R609 Edema, unspecified: Secondary | ICD-10-CM

## 2013-04-11 DIAGNOSIS — G629 Polyneuropathy, unspecified: Secondary | ICD-10-CM

## 2013-04-11 DIAGNOSIS — R600 Localized edema: Secondary | ICD-10-CM

## 2013-04-11 DIAGNOSIS — F0280 Dementia in other diseases classified elsewhere without behavioral disturbance: Secondary | ICD-10-CM

## 2013-04-11 DIAGNOSIS — I82409 Acute embolism and thrombosis of unspecified deep veins of unspecified lower extremity: Secondary | ICD-10-CM

## 2013-04-14 ENCOUNTER — Non-Acute Institutional Stay (SKILLED_NURSING_FACILITY): Payer: Medicare Other | Admitting: Adult Health

## 2013-04-14 DIAGNOSIS — I82409 Acute embolism and thrombosis of unspecified deep veins of unspecified lower extremity: Secondary | ICD-10-CM

## 2013-04-14 DIAGNOSIS — I82402 Acute embolism and thrombosis of unspecified deep veins of left lower extremity: Secondary | ICD-10-CM

## 2013-04-14 DIAGNOSIS — Z7901 Long term (current) use of anticoagulants: Secondary | ICD-10-CM

## 2013-04-21 ENCOUNTER — Non-Acute Institutional Stay (SKILLED_NURSING_FACILITY): Payer: Medicare Other | Admitting: Adult Health

## 2013-04-21 DIAGNOSIS — I82409 Acute embolism and thrombosis of unspecified deep veins of unspecified lower extremity: Secondary | ICD-10-CM

## 2013-04-21 DIAGNOSIS — Z7901 Long term (current) use of anticoagulants: Secondary | ICD-10-CM

## 2013-04-21 DIAGNOSIS — I82402 Acute embolism and thrombosis of unspecified deep veins of left lower extremity: Secondary | ICD-10-CM

## 2013-05-01 ENCOUNTER — Other Ambulatory Visit: Payer: Self-pay | Admitting: *Deleted

## 2013-05-01 MED ORDER — OXYCODONE-ACETAMINOPHEN 5-325 MG PO TABS: ORAL_TABLET | ORAL | Status: DC

## 2013-05-05 ENCOUNTER — Non-Acute Institutional Stay (SKILLED_NURSING_FACILITY): Payer: Medicare Other | Admitting: Adult Health

## 2013-05-05 DIAGNOSIS — Z7901 Long term (current) use of anticoagulants: Secondary | ICD-10-CM

## 2013-05-05 DIAGNOSIS — I82402 Acute embolism and thrombosis of unspecified deep veins of left lower extremity: Secondary | ICD-10-CM

## 2013-05-05 DIAGNOSIS — I82409 Acute embolism and thrombosis of unspecified deep veins of unspecified lower extremity: Secondary | ICD-10-CM

## 2013-05-12 ENCOUNTER — Non-Acute Institutional Stay (SKILLED_NURSING_FACILITY): Payer: Medicare Other | Admitting: Adult Health

## 2013-05-12 DIAGNOSIS — R609 Edema, unspecified: Secondary | ICD-10-CM

## 2013-05-12 DIAGNOSIS — M1990 Unspecified osteoarthritis, unspecified site: Secondary | ICD-10-CM

## 2013-05-12 DIAGNOSIS — G589 Mononeuropathy, unspecified: Secondary | ICD-10-CM

## 2013-05-12 DIAGNOSIS — I82402 Acute embolism and thrombosis of unspecified deep veins of left lower extremity: Secondary | ICD-10-CM

## 2013-05-12 DIAGNOSIS — K5900 Constipation, unspecified: Secondary | ICD-10-CM

## 2013-05-12 DIAGNOSIS — R600 Localized edema: Secondary | ICD-10-CM

## 2013-05-12 DIAGNOSIS — F0280 Dementia in other diseases classified elsewhere without behavioral disturbance: Secondary | ICD-10-CM

## 2013-05-12 DIAGNOSIS — G629 Polyneuropathy, unspecified: Secondary | ICD-10-CM

## 2013-05-12 DIAGNOSIS — I82409 Acute embolism and thrombosis of unspecified deep veins of unspecified lower extremity: Secondary | ICD-10-CM

## 2013-05-12 DIAGNOSIS — Z7901 Long term (current) use of anticoagulants: Secondary | ICD-10-CM

## 2013-05-13 ENCOUNTER — Non-Acute Institutional Stay (SKILLED_NURSING_FACILITY): Payer: Medicare Other | Admitting: Adult Health

## 2013-05-13 DIAGNOSIS — I82409 Acute embolism and thrombosis of unspecified deep veins of unspecified lower extremity: Secondary | ICD-10-CM

## 2013-05-13 DIAGNOSIS — Z7901 Long term (current) use of anticoagulants: Secondary | ICD-10-CM

## 2013-05-13 DIAGNOSIS — I82402 Acute embolism and thrombosis of unspecified deep veins of left lower extremity: Secondary | ICD-10-CM

## 2013-05-26 ENCOUNTER — Non-Acute Institutional Stay (SKILLED_NURSING_FACILITY): Payer: Medicare Other | Admitting: Adult Health

## 2013-05-26 DIAGNOSIS — I82402 Acute embolism and thrombosis of unspecified deep veins of left lower extremity: Secondary | ICD-10-CM

## 2013-05-26 DIAGNOSIS — I82409 Acute embolism and thrombosis of unspecified deep veins of unspecified lower extremity: Secondary | ICD-10-CM

## 2013-05-26 DIAGNOSIS — Z7901 Long term (current) use of anticoagulants: Secondary | ICD-10-CM

## 2013-06-02 ENCOUNTER — Non-Acute Institutional Stay (SKILLED_NURSING_FACILITY): Payer: Medicare Other | Admitting: Adult Health

## 2013-06-02 DIAGNOSIS — I82409 Acute embolism and thrombosis of unspecified deep veins of unspecified lower extremity: Secondary | ICD-10-CM

## 2013-06-02 DIAGNOSIS — I82402 Acute embolism and thrombosis of unspecified deep veins of left lower extremity: Secondary | ICD-10-CM

## 2013-06-02 DIAGNOSIS — Z7901 Long term (current) use of anticoagulants: Secondary | ICD-10-CM

## 2013-06-17 ENCOUNTER — Non-Acute Institutional Stay (SKILLED_NURSING_FACILITY): Payer: Medicare Other | Admitting: Adult Health

## 2013-06-17 DIAGNOSIS — I82402 Acute embolism and thrombosis of unspecified deep veins of left lower extremity: Secondary | ICD-10-CM

## 2013-06-17 DIAGNOSIS — Z7901 Long term (current) use of anticoagulants: Secondary | ICD-10-CM

## 2013-06-17 DIAGNOSIS — I82409 Acute embolism and thrombosis of unspecified deep veins of unspecified lower extremity: Secondary | ICD-10-CM

## 2013-06-19 ENCOUNTER — Non-Acute Institutional Stay (SKILLED_NURSING_FACILITY): Payer: Medicare Other | Admitting: Internal Medicine

## 2013-06-19 DIAGNOSIS — G309 Alzheimer's disease, unspecified: Secondary | ICD-10-CM

## 2013-06-19 DIAGNOSIS — K5900 Constipation, unspecified: Secondary | ICD-10-CM

## 2013-06-19 DIAGNOSIS — F0280 Dementia in other diseases classified elsewhere without behavioral disturbance: Secondary | ICD-10-CM

## 2013-06-19 DIAGNOSIS — G609 Hereditary and idiopathic neuropathy, unspecified: Secondary | ICD-10-CM

## 2013-06-19 DIAGNOSIS — M1990 Unspecified osteoarthritis, unspecified site: Secondary | ICD-10-CM

## 2013-06-24 ENCOUNTER — Non-Acute Institutional Stay (SKILLED_NURSING_FACILITY): Payer: Medicare Other | Admitting: Adult Health

## 2013-06-24 DIAGNOSIS — I82409 Acute embolism and thrombosis of unspecified deep veins of unspecified lower extremity: Secondary | ICD-10-CM

## 2013-06-24 DIAGNOSIS — I82402 Acute embolism and thrombosis of unspecified deep veins of left lower extremity: Secondary | ICD-10-CM

## 2013-06-24 DIAGNOSIS — Z7901 Long term (current) use of anticoagulants: Secondary | ICD-10-CM

## 2013-06-26 DIAGNOSIS — F0280 Dementia in other diseases classified elsewhere without behavioral disturbance: Secondary | ICD-10-CM | POA: Insufficient documentation

## 2013-06-26 DIAGNOSIS — M1990 Unspecified osteoarthritis, unspecified site: Secondary | ICD-10-CM | POA: Insufficient documentation

## 2013-06-26 DIAGNOSIS — G609 Hereditary and idiopathic neuropathy, unspecified: Secondary | ICD-10-CM | POA: Insufficient documentation

## 2013-06-26 DIAGNOSIS — K5900 Constipation, unspecified: Secondary | ICD-10-CM | POA: Insufficient documentation

## 2013-06-26 NOTE — Progress Notes (Signed)
PROGRESS NOTE  DATE: 06/19/2013  FACILITY: Nursing Home Location: [facility]  LEVEL OF CARE: SNF (31)  Routine Visit  CHIEF COMPLAINT:  Manage Alzheimer's dementia and peripheral neuropathy  HISTORY OF PRESENT ILLNESS:  REASSESSMENT OF ONGOING PROBLEM(S):  DEMENTIA: The dementia remaines stable and continues to function adequately in the current living environment with supervision.  The patient has had little changes in behavior. No complications noted from the medications presently being used.  PERIPHERAL NEUROPATHY: The peripheral neuropathy is stable. The patient denies pain in the feet, tingling, and numbness. No complications noted from the medication presently being used.  PAST MEDICAL HISTORY : Reviewed.  No changes.  CURRENT MEDICATIONS: Reviewed per [facility]  REVIEW OF SYSTEMS:  GENERAL: no change in appetite, no fatigue, no weight changes, no fever, chills or weakness RESPIRATORY: no cough, SOB, DOE, wheezing, hemoptysis CARDIAC: no chest pain, edema or palpitations GI: no abdominal pain, diarrhea, constipation, heart burn, nausea or vomiting  PHYSICAL EXAMINATION  VS:  T 97.3       P 80      RR 18      BP 137/66     POX % 97     WT (Lb)  GENERAL: no acute distress, obese body habitus NECK: supple, trachea midline, no neck masses, no thyroid tenderness, no thyromegaly RESPIRATORY: breathing is even & unlabored, BS CTAB CARDIAC: RRR, no murmur,no extra heart sounds, no edema GI: abdomen soft, normal BS, no masses, no tenderness, no hepatomegaly, no splenomegaly PSYCHIATRIC: the patient is alert & oriented to person, affect & behavior appropriate  LABS/RADIOLOGY:  5/14 WBC 3.9, dobutamine 11.9, MCV 96.5, platelets 147, total protein 5.2, albumin 2.9 otherwise CMP normal  ASSESSMENT/PLAN:  Alzheimer's dementia-stable. Peripheral neuropathy-denies ongoing symptoms. Osteoarthritis-pain well-controlled. Constipation-well controlled. Anemia  of chronic disease-stable. Depression-continue Remeron.  CPT CODE: 16109

## 2013-07-08 ENCOUNTER — Non-Acute Institutional Stay (SKILLED_NURSING_FACILITY): Payer: Medicare Other | Admitting: Adult Health

## 2013-07-08 DIAGNOSIS — I82402 Acute embolism and thrombosis of unspecified deep veins of left lower extremity: Secondary | ICD-10-CM

## 2013-07-08 DIAGNOSIS — Z7901 Long term (current) use of anticoagulants: Secondary | ICD-10-CM

## 2013-07-08 DIAGNOSIS — I82409 Acute embolism and thrombosis of unspecified deep veins of unspecified lower extremity: Secondary | ICD-10-CM

## 2013-07-10 ENCOUNTER — Encounter: Payer: Self-pay | Admitting: Adult Health

## 2013-07-10 ENCOUNTER — Other Ambulatory Visit: Payer: Self-pay | Admitting: *Deleted

## 2013-07-10 DIAGNOSIS — G629 Polyneuropathy, unspecified: Secondary | ICD-10-CM | POA: Insufficient documentation

## 2013-07-10 DIAGNOSIS — R600 Localized edema: Secondary | ICD-10-CM | POA: Insufficient documentation

## 2013-07-10 MED ORDER — OXYCODONE-ACETAMINOPHEN 5-325 MG PO TABS: ORAL_TABLET | ORAL | Status: DC

## 2013-07-10 NOTE — Progress Notes (Signed)
  Subjective:    Patient ID: Jenna Barber, female    DOB: 01/18/1929, 79 y.o.   MRN: [id number]  HPI This is an 79 year old female who is being seen for a routine visit. She has been stable for the past month. Latest INR is 2.3 - therapeutic. No complaints of shortness of breath nor chest pain. She is on chronic Coumadin therapy due to left lower extremity DVT.  Review of Systems  Constitutional: Negative.   HENT: Negative.   Eyes: Negative.   Respiratory: Negative for cough and shortness of breath.   Cardiovascular: Positive for leg swelling. Negative for chest pain.  Gastrointestinal: Negative.   Endocrine: Negative.   Genitourinary: Negative.   Neurological: Negative.   Hematological: Negative for adenopathy. Does not bruise/bleed easily.  Psychiatric/Behavioral: Negative.        Objective:   Physical Exam  Nursing note and vitals reviewed. Constitutional: She is oriented to person, place, and time. She appears well-developed and well-nourished.  HENT:  Head: Normocephalic and atraumatic.  Right Ear: External ear normal.  Left Ear: External ear normal.  Nose: Nose normal.  Mouth/Throat: Oropharynx is clear and moist.  Eyes: Conjunctivae and EOM are normal. Pupils are equal, round, and reactive to light.  Neck: Normal range of motion. Neck supple.  Cardiovascular: Normal rate, regular rhythm and normal heart sounds.   Pulmonary/Chest: Effort normal and breath sounds normal. No respiratory distress.  Abdominal: Soft. Bowel sounds are normal.  Musculoskeletal: She exhibits edema. She exhibits no tenderness.  BLE edema, 2+ Generalized weakness of BLE  Neurological: She is alert and oriented to person, place, and time.  Skin: Skin is warm and dry.  Psychiatric: She has a normal mood and affect. Her behavior is normal. Judgment and thought content normal.     LABS/PROCEDURE:  10/27/12 sodium 143 potassium 3.7 glucose 79 BUN 21 creatinine 0.65 calcium 8.5   10/02/13 WBC 4.5  hemoglobin 11.2 hematocrit 33.3 sodium 145 potassium 3.8 glucose 78 BUN 18 creatinine 0.72 total protein 5.1 albumin 3.1 calcium 8.4 07/22/12 left lower extremity duplex venous ultrasound positive for DVT      Medications reviewed.   Assessment & Plan:   Edema extremities - stable  Neuropathy - stable  Alzheimer's disease - stable  Osteoarthrosis, unspecified whether generalized or localized, unspecified site - stable  Unspecified constipation - stable  DVT (deep venous thrombosis) - no pain; stable  Long term (current) use of anticoagulants - continue Coumadin 2.5 mg by mouth daily and repeat INR on 04/14/13

## 2013-07-10 NOTE — Progress Notes (Signed)
  Subjective:    Patient ID: Jenna Barber, female    DOB: 1929-11-03, 79 y.o.   MRN: [id number]  HPI  This is an 79 year old female who is being seen for a routine visit. She has been stable for the past month. Latest INR is 4.0- supratherapeutic. No bleeding nor bruising noted. She is on chronic Coumadin therapy due to left lower extremity DVT.  Review of Systems  Constitutional: Negative.   Respiratory: Negative for cough and shortness of breath.   Cardiovascular: Positive for leg swelling. Negative for chest pain.  Gastrointestinal: Negative.   Endocrine: Negative.   Genitourinary: Negative.   Neurological: Negative.   Hematological: Negative for adenopathy. Does not bruise/bleed easily.  Psychiatric/Behavioral: Negative.        Objective:   Physical Exam  Nursing note and vitals reviewed. Constitutional: She is oriented to person, place, and time. She appears well-developed and well-nourished.  HENT:  Head: Normocephalic and atraumatic.  Right Ear: External ear normal.  Left Ear: External ear normal.  Nose: Nose normal.  Mouth/Throat: Oropharynx is clear and moist.  Eyes: Conjunctivae and EOM are normal. Pupils are equal, round, and reactive to light.  Neck: Normal range of motion. Neck supple. No thyromegaly present.  Cardiovascular: Normal rate, regular rhythm and normal heart sounds.   Pulmonary/Chest: Effort normal and breath sounds normal. No respiratory distress.  Abdominal: Soft. Bowel sounds are normal. She exhibits no distension. There is no tenderness.  Musculoskeletal: She exhibits edema. She exhibits no tenderness.  BLE edema, 2+ Generalized weakness of BLE  Neurological: She is alert and oriented to person, place, and time.  Skin: Skin is warm and dry.  Psychiatric: She has a normal mood and affect. Her behavior is normal. Judgment and thought content normal.     LABS/PROCEDURE:  10/27/12 sodium 143 potassium 3.7 glucose 79 BUN 21 creatinine 0.65 calcium 8.5    10/02/13 WBC 4.5 hemoglobin 11.2 hematocrit 33.3 sodium 145 potassium 3.8 glucose 78 BUN 18 creatinine 0.72 total protein 5.1 albumin 3.1 calcium 8.4 07/22/12 left lower extremity duplex venous ultrasound positive for DVT      Medications reviewed.   Assessment & Plan:   Edema extremities - stable  Neuropathy - stable  Alzheimer's disease - stable  Osteoarthrosis, unspecified whether generalized or localized, unspecified site - stable  Unspecified constipation - stable  DVT (deep venous thrombosis) - no pain; stable  Long term (current) use of anticoagulants -hold Coumadin and repeat INR on 05/13/13

## 2013-07-11 ENCOUNTER — Encounter: Payer: Self-pay | Admitting: Adult Health

## 2013-07-11 NOTE — Progress Notes (Signed)
Patient ID: JULIE-ANN VANMAANEN, female   DOB: 09/25/1929, 79 y.o.   MRN: [id number]  Subjective:     Indication: DVT Bleeding signs/symptoms: None Thromboembolic signs/symptoms: None  Missed Coumadin doses: None Medication changes: no Dietary changes: no Bacterial/viral infection: no Other concerns: no   Review of Systems A comprehensive review of systems was negative.   Objective:    INR Today: 2.4  Current dose: 2.5 mg   Assessment:   therapeutic INR for goal of  2-3.5  Plan:    1. New dose:  Coumadin to 2.5mg  PO Q D  - same 2. Next INR:  04/21/13

## 2013-07-11 NOTE — Progress Notes (Signed)
Patient ID: Jenna Barber, female   DOB: [birth year], 79 y.o.   MRN: [id number]  Subjective:     Indication: DVT Bleeding signs/symptoms: None Thromboembolic signs/symptoms: None  Missed Coumadin doses: None Medication changes: no Dietary changes: no Bacterial/viral infection: no Other concerns: no   Review of Systems A comprehensive review of systems was negative.   Objective:    INR Today: 2.4  Current dose: 2.5 mg   Assessment:   therapeutic INR for goal of  2-3.5  Plan:    1. New dose:  Coumadin to 2.5mg  PO Q D  - same 2. Next INR:  04/28/13

## 2013-07-11 NOTE — Progress Notes (Signed)
Patient ID: Jenna Barber, female   DOB: 1929-01-12, 79 y.o.   MRN: [id number]  Subjective:     Indication: DVT Bleeding signs/symptoms: None Thromboembolic signs/symptoms: None  Missed Coumadin doses: None Medication changes: no Dietary changes: no Bacterial/viral infection: no Other concerns: no   Review of Systems A comprehensive review of systems was negative.   Objective:    INR Today:1.3 Current dose: 1 mg   Assessment:    Subtherapeutic INR for goal of  2-3.5  Plan:    1. New dose:  Coumadin 3 mg PO X 1 then Coumadin 2 mg PO Q D   2. Next INR:  04/07/13

## 2013-07-11 NOTE — Progress Notes (Signed)
Patient ID: Jenna Barber, female   DOB: Jan 15, 1929, 79 y.o.   MRN: [id number]  Subjective:     Indication: DVT Bleeding signs/symptoms: None Thromboembolic signs/symptoms: None  Missed Coumadin doses: None Medication changes: no Dietary changes: no Bacterial/viral infection: no Other concerns: no   Review of Systems A comprehensive review of systems was negative.   Objective:    INR Today:1.5 Current dose: 2 mg   Assessment:   Subtherapeutic INR for goal of  2-3.5  Plan:    1. New dose: increase Coumadin to 2.5mg  PO Q D   2. Next INR:  04/10/13

## 2013-07-11 NOTE — Progress Notes (Signed)
Patient ID: Jenna Barber, female   DOB: 04/10/1929, 79 y.o.   MRN: [id number]  Subjective:     Indication: DVT Bleeding signs/symptoms: None Thromboembolic signs/symptoms: None  Missed Coumadin doses: None Medication changes: no Dietary changes: no Bacterial/viral infection: no Other concerns: no   Review of Systems A comprehensive review of systems was negative.   Objective:    INR Today:3.3 Current dose: 1.5 mg   Assessment:    Supratherapeutic INR for goal of  2-3.5  Plan:    1. New dose: decrease Coumadin to 1 mg PO Q D   2. Next INR:  04/03/13

## 2013-07-12 ENCOUNTER — Encounter: Payer: Self-pay | Admitting: Adult Health

## 2013-07-12 NOTE — Progress Notes (Signed)
Patient ID: Jenna Barber, female   DOB: Apr 29, 1929, 79 y.o.   MRN: [id number]  Subjective:     Indication: DVT Bleeding signs/symptoms: None Thromboembolic signs/symptoms: None  Missed Coumadin doses: None Medication changes: no Dietary changes: no Bacterial/viral infection: no Other concerns: no   Review of Systems A comprehensive review of systems was negative.   Objective:    INR Today: 2.3  Current dose: 3 mg   Assessment:   therapeutic INR for goal of  2-3.5  Plan:    1. New dose:  Coumadin to 3 mg PO Q D  - same 2. Next INR: 05/12/13

## 2013-07-12 NOTE — Progress Notes (Signed)
Patient ID: Jenna Barber, female   DOB: 09-09-1929, 79 y.o.   MRN: [id number]  Subjective:     Indication: DVT Bleeding signs/symptoms: None Thromboembolic signs/symptoms: None  Missed Coumadin doses: None Medication changes: no Dietary changes: no Bacterial/viral infection: no Other concerns: no   Review of Systems A comprehensive review of systems was negative.   Objective:    INR Today: 2.2  Current dose: Coumadin 2 mg by mouth daily Assessment:   therapeutic INR for goal of  2-3.5  Plan:    1. New dose:   Coumadin to 2 mg PO Q D  -same 2. Next INR: 06/09/13

## 2013-07-12 NOTE — Progress Notes (Signed)
Patient ID: Jenna Barber, female   DOB: 1929-06-01, 79 y.o.   MRN: [id number]  Subjective:     Indication: DVT Bleeding signs/symptoms: None Thromboembolic signs/symptoms: None  Missed Coumadin doses: None Medication changes: no Dietary changes: no Bacterial/viral infection: no Other concerns: no   Review of Systems A comprehensive review of systems was negative.   Objective:    INR Today: 2.2  Current dose: 3 mg   Assessment:   therapeutic INR for goal of  2-3.5  Plan:    1. New dose:   Coumadin to 2 mg PO Q D  -same 2. Next INR: 06/02/13

## 2013-07-12 NOTE — Progress Notes (Signed)
Patient ID: CIARRA BRADDY, female   DOB: 1929-01-10, 79 y.o.   MRN: [id number]  Subjective:     Indication: DVT Bleeding signs/symptoms: None Thromboembolic signs/symptoms: None  Missed Coumadin doses: None Medication changes: no Dietary changes: no Bacterial/viral infection: no Other concerns: no   Review of Systems A comprehensive review of systems was negative.   Objective:    INR Today: 1.9 Current dose: Coumadin 2 mg by mouth daily Assessment:   subtherapeutic INR for goal of  2-3.5  Plan:    1. New dose:   Coumadin to 2 mg PO Q D  -same 2. Next INR: 06/30/13

## 2013-07-12 NOTE — Progress Notes (Signed)
Patient ID: Jenna Barber, female   DOB: 10/08/1929, 79 y.o.   MRN: [id number]  Subjective:     Indication: DVT Bleeding signs/symptoms: None Thromboembolic signs/symptoms: None  Missed Coumadin doses: None Medication changes: no Dietary changes: no Bacterial/viral infection: no Other concerns: no   Review of Systems A comprehensive review of systems was negative.   Objective:    INR Today: 1.7 Current dose: Coumadin 2.mg by mouth daily  Assessment:   subtherapeutic INR for goal of  2-3.5  Plan:    1. New dose:   Coumadin to 2.5 mg PO Q D  -new 2. Next INR: 07/10/13

## 2013-07-12 NOTE — Progress Notes (Signed)
Patient ID: Jenna Barber, female   DOB: 19-Jul-1929, 79 y.o.   MRN: [id number]  Subjective:     Indication: DVT Bleeding signs/symptoms: None Thromboembolic signs/symptoms: None  Missed Coumadin doses: None Medication changes: no Dietary changes: no Bacterial/viral infection: no Other concerns: no   Review of Systems A comprehensive review of systems was negative.   Objective:    INR Today: 2.3 Current dose: Coumadin 2 mg by mouth daily Assessment:   therapeutic INR for goal of  2-3.5  Plan:    1. New dose:   Coumadin to 2 mg PO Q D  -same 2. Next INR: 06/23/13

## 2013-07-12 NOTE — Progress Notes (Signed)
Patient ID: Jenna Barber, female   DOB: September 02, 1929, 79 y.o.   MRN: [id number]  Subjective:     Indication: DVT Bleeding signs/symptoms: None Thromboembolic signs/symptoms: None  Missed Coumadin doses: 1 due to supratherapeutic INR  Medication changes: no Dietary changes: no Bacterial/viral infection: no Other concerns: no   Review of Systems A comprehensive review of systems was negative.   Objective:    INR Today: 3.0 Current dose: 3 mg   Assessment:   therapeutic INR for goal of  2-3.5  Plan:    1. New dose:  Start  Coumadin to 2.5 mg PO Q D   2. Next INR: 05/19/13

## 2013-07-17 ENCOUNTER — Non-Acute Institutional Stay (SKILLED_NURSING_FACILITY): Payer: Medicare Other | Admitting: Internal Medicine

## 2013-07-17 DIAGNOSIS — G309 Alzheimer's disease, unspecified: Secondary | ICD-10-CM

## 2013-07-17 DIAGNOSIS — M1990 Unspecified osteoarthritis, unspecified site: Secondary | ICD-10-CM

## 2013-07-17 DIAGNOSIS — G609 Hereditary and idiopathic neuropathy, unspecified: Secondary | ICD-10-CM

## 2013-07-17 DIAGNOSIS — F0280 Dementia in other diseases classified elsewhere without behavioral disturbance: Secondary | ICD-10-CM

## 2013-07-17 DIAGNOSIS — K5900 Constipation, unspecified: Secondary | ICD-10-CM

## 2013-07-24 NOTE — Progress Notes (Signed)
PROGRESS NOTE  DATE: 07-17-13  FACILITY: Nursing Home Location: [facility]  LEVEL OF CARE: SNF (31)  Routine Visit  CHIEF COMPLAINT:  Manage Alzheimer's dementia and peripheral neuropathy  HISTORY OF PRESENT ILLNESS:  REASSESSMENT OF ONGOING PROBLEM(S):  DEMENTIA: The dementia remaines stable and continues to function adequately in the current living environment with supervision.  The patient has had little changes in behavior. No complications noted from the medications presently being used.  PERIPHERAL NEUROPATHY: The peripheral neuropathy is stable. The patient denies pain in the feet, tingling, and numbness. No complications noted from the medication presently being used.  PAST MEDICAL HISTORY : Reviewed.  No changes.  CURRENT MEDICATIONS: Reviewed per [facility]  REVIEW OF SYSTEMS:  GENERAL: no change in appetite, no fatigue, no weight changes, no fever, chills or weakness RESPIRATORY: no cough, SOB, DOE, wheezing, hemoptysis CARDIAC: no chest pain, edema or palpitations GI: no abdominal pain, diarrhea, constipation, heart burn, nausea or vomiting  PHYSICAL EXAMINATION  VS:  T 97.1       P 69      RR 16      BP 110/58     POX % 95     WT (Lb)  GENERAL: no acute distress, obese body habitus NECK: supple, trachea midline, no neck masses, no thyroid tenderness, no thyromegaly RESPIRATORY: breathing is even & unlabored, BS CTAB CARDIAC: RRR, no murmur,no extra heart sounds, no edema GI: abdomen soft, normal BS, no masses, no tenderness, no hepatomegaly, no splenomegaly PSYCHIATRIC: the patient is alert & oriented to person, affect & behavior appropriate  LABS/RADIOLOGY:  5/14 WBC 3.9, dobutamine 11.9, MCV 96.5, platelets 147, total protein 5.2, albumin 2.9 otherwise CMP normal  ASSESSMENT/PLAN:  Alzheimer's dementia-stable. Peripheral neuropathy-denies ongoing symptoms. Osteoarthritis-pain well-controlled. Constipation-well controlled. Anemia of  chronic disease-stable. Depression-continue Remeron.  CPT CODE: 16109

## 2013-08-07 ENCOUNTER — Non-Acute Institutional Stay (SKILLED_NURSING_FACILITY): Payer: Medicare Other | Admitting: Adult Health

## 2013-08-07 DIAGNOSIS — G309 Alzheimer's disease, unspecified: Secondary | ICD-10-CM

## 2013-08-07 DIAGNOSIS — I82409 Acute embolism and thrombosis of unspecified deep veins of unspecified lower extremity: Secondary | ICD-10-CM

## 2013-08-07 DIAGNOSIS — G629 Polyneuropathy, unspecified: Secondary | ICD-10-CM

## 2013-08-07 DIAGNOSIS — F329 Major depressive disorder, single episode, unspecified: Secondary | ICD-10-CM

## 2013-08-07 DIAGNOSIS — R609 Edema, unspecified: Secondary | ICD-10-CM

## 2013-08-07 DIAGNOSIS — K5900 Constipation, unspecified: Secondary | ICD-10-CM

## 2013-08-07 DIAGNOSIS — I82402 Acute embolism and thrombosis of unspecified deep veins of left lower extremity: Secondary | ICD-10-CM

## 2013-08-07 DIAGNOSIS — R600 Localized edema: Secondary | ICD-10-CM

## 2013-08-07 DIAGNOSIS — G589 Mononeuropathy, unspecified: Secondary | ICD-10-CM

## 2013-08-07 DIAGNOSIS — M1990 Unspecified osteoarthritis, unspecified site: Secondary | ICD-10-CM

## 2013-08-07 DIAGNOSIS — F0280 Dementia in other diseases classified elsewhere without behavioral disturbance: Secondary | ICD-10-CM

## 2013-08-07 DIAGNOSIS — Z7901 Long term (current) use of anticoagulants: Secondary | ICD-10-CM

## 2013-08-27 ENCOUNTER — Encounter: Payer: Self-pay | Admitting: Adult Health

## 2013-08-27 NOTE — Progress Notes (Signed)
Patient ID: Jenna Barber, female   DOB: January 24, 1929, 79 y.o.   MRN: [id number]       PROGRESS NOTE  DATE: 08/07/2013  FACILITY: Nursing Home Location: [facility]  LEVEL OF CARE: SNF (31)  Routine Visit  CHIEF COMPLAINT:  Manage neuropathy, depression, edema and constipation  HISTORY OF PRESENT ILLNESS:  REASSESSMENT OF ONGOING PROBLEM(S):  DEPRESSION: The depression remains stable. Patient denies ongoing feelings of sadness, insomnia, anedhonia or lack of appetite. No complications reported from the medications currently being used. Staff do not report behavioral problems.  CONSTIPATION: The constipation remains stable. No complications from the medications presently being used. Patient denies ongoing constipation, abdominal pain, nausea or vomiting. PAST MEDICAL HISTORY : Reviewed.  No changes.  PERIPHERAL NEUROPATHY: The peripheral neuropathy is stable. The patient denies pain in the feet, tingling, and numbness. No complications noted from the medication presently being used.   CURRENT MEDICATIONS: Reviewed per [facility]  REVIEW OF SYSTEMS:  GENERAL: no change in appetite, no fatigue, no weight changes, no fever, chills or weakness RESPIRATORY: no cough, SOB, DOE, wheezing, hemoptysis CARDIAC: no chest pain, or palpitations GI: no abdominal pain, diarrhea, constipation, heart burn, nausea or vomiting  PHYSICAL EXAMINATION  VS:  T 98       P 80      RR 20      BP 121/64     POX 95 %     WT 171 (Lb)  GENERAL: no acute distress, normal body habitus EYES: conjunctivae normal, sclerae normal, normal eye lids NECK: supple, trachea midline, no neck masses, no thyroid tenderness, no thyromegaly LYMPHATICS: no LAN in the neck, no supraclavicular LAN RESPIRATORY: breathing is even & unlabored, BS CTAB CARDIAC: RRR, no murmur,no extra heart sounds, no edema GI: abdomen soft, normal BS, no masses, no tenderness, no hepatomegaly, no splenomegaly PSYCHIATRIC: the patient is  alert & oriented to person, affect & behavior appropriate  LABS/RADIOLOGY: 5/14 WBC 3.9, dobutamine 11.9, MCV 96.5, platelets 147, total protein 5.2, albumin 2.9 otherwise CMP normal  ASSESSMENT/PLAN:  Peripheral neuropathy - stable  Depression - stable  Alzheimer's Disease - stable  Edema, bilateral lower extremity - stable   DVT, LLE - stable  Long term [place] of anticoagulant -  INR 1.7 - subtherapeutic; and increase Coumadin to 2.5 mg by mouth daily; repeat INR on 08/18/13  Constipation - no complainst   Osteoarthritis - no complaints of pain in   CPT CODE: 44010

## 2013-09-18 ENCOUNTER — Non-Acute Institutional Stay (SKILLED_NURSING_FACILITY): Payer: Medicare Other | Admitting: Adult Health

## 2013-09-18 DIAGNOSIS — R600 Localized edema: Secondary | ICD-10-CM

## 2013-09-18 DIAGNOSIS — K5900 Constipation, unspecified: Secondary | ICD-10-CM

## 2013-09-18 DIAGNOSIS — G589 Mononeuropathy, unspecified: Secondary | ICD-10-CM

## 2013-09-18 DIAGNOSIS — I82402 Acute embolism and thrombosis of unspecified deep veins of left lower extremity: Secondary | ICD-10-CM

## 2013-09-18 DIAGNOSIS — R609 Edema, unspecified: Secondary | ICD-10-CM

## 2013-09-18 DIAGNOSIS — I82409 Acute embolism and thrombosis of unspecified deep veins of unspecified lower extremity: Secondary | ICD-10-CM

## 2013-09-18 DIAGNOSIS — M1990 Unspecified osteoarthritis, unspecified site: Secondary | ICD-10-CM

## 2013-09-18 DIAGNOSIS — G629 Polyneuropathy, unspecified: Secondary | ICD-10-CM

## 2013-09-18 DIAGNOSIS — F0280 Dementia in other diseases classified elsewhere without behavioral disturbance: Secondary | ICD-10-CM

## 2013-09-18 DIAGNOSIS — Z7901 Long term (current) use of anticoagulants: Secondary | ICD-10-CM

## 2013-09-18 NOTE — Progress Notes (Signed)
Patient ID: Jenna Barber, female   DOB: Apr 09, 1929, 79 y.o.   MRN: [id number]       PROGRESS NOTE  DATE: 09/18/13  FACILITY: Nursing Home Location: [facility]  LEVEL OF CARE: SNF (31)  Routine Visit  CHIEF COMPLAINT:  Manage neuropathy, depression, edema and constipation  HISTORY OF PRESENT ILLNESS:  REASSESSMENT OF ONGOING PROBLEM(S):  ALZHEIMER'S DISEASE: The dementia remaines stable and continues to function adequately in the current living environment with supervision.  The patient has had little changes in behavior. No complications noted from the medications presently being used.  DEPRESSION: The depression remains stable. Patient denies ongoing feelings of sadness, insomnia, anedhonia or lack of appetite. No complications reported from the medications currently being used. Staff do not report behavioral problems.  PERIPHERAL NEUROPATHY: The peripheral neuropathy is stable. The patient denies pain in the feet, tingling, and numbness. No complications noted from the medication presently being used.  PAST MEDICAL HISTORY : Reviewed.  No changes.   CURRENT MEDICATIONS: Reviewed per [facility]  REVIEW OF SYSTEMS:  GENERAL: no change in appetite, no fatigue, no weight changes, no fever, chills or weakness RESPIRATORY: no cough, SOB, DOE, wheezing, hemoptysis CARDIAC: no chest pain, or palpitations GI: no abdominal pain, diarrhea, constipation, heart burn, nausea or vomiting  PHYSICAL EXAMINATION  VS:  T 98       P 80      RR 20      BP 121/64     POX 95 %     WT 171 (Lb)  GENERAL: no acute distress, normal body habitus NECK: supple, trachea midline, no neck masses, no thyroid tenderness, no thyromegaly LYMPHATICS: no LAN in the neck, no supraclavicular LAN RESPIRATORY: breathing is even & unlabored, BS CTAB CARDIAC: RRR, no murmur,no extra heart sounds, BLE edema, 2+ GI: abdomen soft, normal BS, no masses, no tenderness, no hepatomegaly, no  splenomegaly PSYCHIATRIC: the patient is alert & oriented to person, affect & behavior appropriate  LABS/RADIOLOGY: 5/14 WBC 3.9, dobutamine 11.9, MCV 96.5, platelets 147, total protein 5.2, albumin 2.9 otherwise CMP normal  ASSESSMENT/PLAN:  Peripheral neuropathy - stable  Depression - stable  Alzheimer's Disease - stable  Edema, bilateral lower extremity - stable   DVT, LLE - continue Coumadin  Constipation - no complainst   Osteoarthritis - no complaints of pain    CPT CODE: 96295

## 2013-09-22 ENCOUNTER — Other Ambulatory Visit: Payer: Self-pay

## 2013-09-22 MED ORDER — OXYCODONE-ACETAMINOPHEN 5-325 MG PO TABS: ORAL_TABLET | ORAL | Status: DC

## 2013-09-22 NOTE — Telephone Encounter (Signed)
Verified dose and instructions reflect manual request received by nursing home.   

## 2013-11-25 ENCOUNTER — Encounter: Payer: Self-pay | Admitting: Internal Medicine

## 2013-11-25 ENCOUNTER — Non-Acute Institutional Stay (SKILLED_NURSING_FACILITY): Payer: Medicare Other | Admitting: Internal Medicine

## 2013-11-25 DIAGNOSIS — G609 Hereditary and idiopathic neuropathy, unspecified: Secondary | ICD-10-CM

## 2013-11-25 DIAGNOSIS — K5900 Constipation, unspecified: Secondary | ICD-10-CM

## 2013-11-25 DIAGNOSIS — M1990 Unspecified osteoarthritis, unspecified site: Secondary | ICD-10-CM

## 2013-11-25 DIAGNOSIS — F0280 Dementia in other diseases classified elsewhere without behavioral disturbance: Secondary | ICD-10-CM

## 2013-11-25 NOTE — Progress Notes (Signed)
PROGRESS NOTE  DATE: 11-25-13  FACILITY: Nursing Home Location: [facility]  LEVEL OF CARE: SNF (31)  Routine Visit  CHIEF COMPLAINT:  Manage Alzheimer's dementia and peripheral neuropathy  HISTORY OF PRESENT ILLNESS:  REASSESSMENT OF ONGOING PROBLEM(S):  DEMENTIA: The dementia remaines stable and continues to function adequately in the current living environment with supervision.  The patient has had little changes in behavior. No complications noted from the medications presently being used.  PERIPHERAL NEUROPATHY: The peripheral neuropathy is stable. The patient denies pain in the feet, tingling, and numbness. No complications noted from the medication presently being used.  PAST MEDICAL HISTORY : Reviewed.  No changes.  CURRENT MEDICATIONS: Reviewed per [facility]  REVIEW OF SYSTEMS: Unobtainable due to patient being a poor historian  PHYSICAL EXAMINATION  VS:  T 97.5       P 75      RR 16      BP 92/51     POX % 91     WT (Lb)  GENERAL: no acute distress, obese body habitus NECK: supple, trachea midline, no neck masses, no thyroid tenderness, no thyromegaly RESPIRATORY: breathing is even & unlabored, BS CTAB CARDIAC: RRR, no murmur,no extra heart sounds, no edema GI: abdomen soft, normal BS, no masses, no tenderness, no hepatomegaly, no splenomegaly PSYCHIATRIC: the patient is alert & oriented to person, affect & behavior appropriate  LABS/RADIOLOGY:  5/14 WBC 3.9, dobutamine 11.9, MCV 96.5, platelets 147, total protein 5.2, albumin 2.9 otherwise CMP normal  ASSESSMENT/PLAN:  Alzheimer's dementia-stable. Peripheral neuropathy-denies ongoing symptoms. Osteoarthritis-pain well-controlled. Constipation-well controlled. Anemia of chronic disease-stable. Depression-continue Remeron. Check CBC and CMP  CPT CODE: 16109

## 2013-11-29 ENCOUNTER — Emergency Department (HOSPITAL_COMMUNITY): Payer: Medicare Other

## 2013-11-29 ENCOUNTER — Encounter (HOSPITAL_COMMUNITY): Payer: Self-pay | Admitting: Emergency Medicine

## 2013-11-29 ENCOUNTER — Inpatient Hospital Stay (HOSPITAL_COMMUNITY)
Admission: EM | Admit: 2013-11-29 | Discharge: 2013-12-03 | DRG: 470 | Disposition: A | Discharge status: Skilled Nursing Facility | Payer: Medicare Other | Attending: Internal Medicine | Admitting: Internal Medicine

## 2013-11-29 DIAGNOSIS — I10 Essential (primary) hypertension: Secondary | ICD-10-CM | POA: Diagnosis present

## 2013-11-29 DIAGNOSIS — Z96659 Presence of unspecified artificial knee joint: Secondary | ICD-10-CM

## 2013-11-29 DIAGNOSIS — F329 Major depressive disorder, single episode, unspecified: Secondary | ICD-10-CM | POA: Diagnosis present

## 2013-11-29 DIAGNOSIS — S72009A Fracture of unspecified part of neck of unspecified femur, initial encounter for closed fracture: Secondary | ICD-10-CM

## 2013-11-29 DIAGNOSIS — I82402 Acute embolism and thrombosis of unspecified deep veins of left lower extremity: Secondary | ICD-10-CM

## 2013-11-29 DIAGNOSIS — G309 Alzheimer's disease, unspecified: Secondary | ICD-10-CM | POA: Diagnosis present

## 2013-11-29 DIAGNOSIS — W06XXXA Fall from bed, initial encounter: Secondary | ICD-10-CM | POA: Diagnosis present

## 2013-11-29 DIAGNOSIS — M1990 Unspecified osteoarthritis, unspecified site: Secondary | ICD-10-CM | POA: Diagnosis present

## 2013-11-29 DIAGNOSIS — F3289 Other specified depressive episodes: Secondary | ICD-10-CM | POA: Diagnosis present

## 2013-11-29 DIAGNOSIS — M109 Gout, unspecified: Secondary | ICD-10-CM | POA: Diagnosis present

## 2013-11-29 DIAGNOSIS — I82409 Acute embolism and thrombosis of unspecified deep veins of unspecified lower extremity: Secondary | ICD-10-CM

## 2013-11-29 DIAGNOSIS — G20A1 Parkinson's disease without dyskinesia, without mention of fluctuations: Secondary | ICD-10-CM | POA: Diagnosis present

## 2013-11-29 DIAGNOSIS — F0280 Dementia in other diseases classified elsewhere without behavioral disturbance: Secondary | ICD-10-CM | POA: Diagnosis present

## 2013-11-29 DIAGNOSIS — R600 Localized edema: Secondary | ICD-10-CM

## 2013-11-29 DIAGNOSIS — G629 Polyneuropathy, unspecified: Secondary | ICD-10-CM

## 2013-11-29 DIAGNOSIS — Z7901 Long term (current) use of anticoagulants: Secondary | ICD-10-CM

## 2013-11-29 DIAGNOSIS — Z66 Do not resuscitate: Secondary | ICD-10-CM | POA: Diagnosis present

## 2013-11-29 DIAGNOSIS — Z882 Allergy status to sulfonamides status: Secondary | ICD-10-CM

## 2013-11-29 DIAGNOSIS — N390 Urinary tract infection, site not specified: Secondary | ICD-10-CM | POA: Diagnosis present

## 2013-11-29 DIAGNOSIS — Z880 Allergy status to penicillin: Secondary | ICD-10-CM

## 2013-11-29 DIAGNOSIS — M6240 Contracture of muscle, unspecified site: Secondary | ICD-10-CM | POA: Diagnosis present

## 2013-11-29 DIAGNOSIS — Z86718 Personal history of other venous thrombosis and embolism: Secondary | ICD-10-CM

## 2013-11-29 DIAGNOSIS — Y9210 Unspecified residential institution as the place of occurrence of the external cause: Secondary | ICD-10-CM | POA: Diagnosis present

## 2013-11-29 DIAGNOSIS — S72002A Fracture of unspecified part of neck of left femur, initial encounter for closed fracture: Secondary | ICD-10-CM

## 2013-11-29 DIAGNOSIS — G4700 Insomnia, unspecified: Secondary | ICD-10-CM | POA: Diagnosis present

## 2013-11-29 DIAGNOSIS — K5900 Constipation, unspecified: Secondary | ICD-10-CM

## 2013-11-29 DIAGNOSIS — Z79899 Other long term (current) drug therapy: Secondary | ICD-10-CM

## 2013-11-29 DIAGNOSIS — E785 Hyperlipidemia, unspecified: Secondary | ICD-10-CM | POA: Diagnosis present

## 2013-11-29 DIAGNOSIS — Z881 Allergy status to other antibiotic agents status: Secondary | ICD-10-CM

## 2013-11-29 DIAGNOSIS — G20 Parkinson's disease: Secondary | ICD-10-CM | POA: Diagnosis present

## 2013-11-29 DIAGNOSIS — Z888 Allergy status to other drugs, medicaments and biological substances status: Secondary | ICD-10-CM

## 2013-11-29 DIAGNOSIS — Z87891 Personal history of nicotine dependence: Secondary | ICD-10-CM

## 2013-11-29 DIAGNOSIS — G609 Hereditary and idiopathic neuropathy, unspecified: Secondary | ICD-10-CM

## 2013-11-29 HISTORY — DX: Hyperlipidemia, unspecified: E78.5

## 2013-11-29 HISTORY — DX: Polyneuropathy, unspecified: G62.9

## 2013-11-29 HISTORY — DX: Dorsalgia, unspecified: M54.9

## 2013-11-29 HISTORY — DX: Parkinson's disease without dyskinesia, without mention of fluctuations: G20.A1

## 2013-11-29 HISTORY — DX: Insomnia, unspecified: G47.00

## 2013-11-29 HISTORY — DX: Unspecified hearing loss, unspecified ear: H91.90

## 2013-11-29 HISTORY — DX: Constipation, unspecified: K59.00

## 2013-11-29 HISTORY — DX: Major depressive disorder, single episode, unspecified: F32.9

## 2013-11-29 HISTORY — DX: Depression, unspecified: F32.A

## 2013-11-29 HISTORY — DX: Gout, unspecified: M10.9

## 2013-11-29 HISTORY — DX: Dementia in other diseases classified elsewhere, unspecified severity, without behavioral disturbance, psychotic disturbance, mood disturbance, and anxiety: F02.80

## 2013-11-29 HISTORY — DX: Essential (primary) hypertension: I10

## 2013-11-29 HISTORY — DX: Unspecified osteoarthritis, unspecified site: M19.90

## 2013-11-29 HISTORY — DX: Alzheimer's disease, unspecified: G30.9

## 2013-11-29 HISTORY — DX: Parkinson's disease: G20

## 2013-11-29 LAB — CBC WITH DIFFERENTIAL/PLATELET
Basophils Absolute: 0 10*3/uL (ref 0.0–0.1)
Eosinophils Relative: 4 % (ref 0–5)
HCT: 39.1 % (ref 36.0–46.0)
Hemoglobin: 13.4 g/dL (ref 12.0–15.0)
Lymphocytes Relative: 26 % (ref 12–46)
Lymphs Abs: 1.8 10*3/uL (ref 0.7–4.0)
MCV: 97.3 fL (ref 78.0–100.0)
Monocytes Absolute: 0.6 10*3/uL (ref 0.1–1.0)
Monocytes Relative: 9 % (ref 3–12)
Neutro Abs: 4 10*3/uL (ref 1.7–7.7)
Neutrophils Relative %: 60 % (ref 43–77)
RDW: 15.4 % (ref 11.5–15.5)
WBC: 6.7 10*3/uL (ref 4.0–10.5)

## 2013-11-29 LAB — BASIC METABOLIC PANEL
CO2: 29 mEq/L (ref 19–32)
Calcium: 8.9 mg/dL (ref 8.4–10.5)
Chloride: 100 mEq/L (ref 96–112)
Creatinine, Ser: 0.7 mg/dL (ref 0.50–1.10)
GFR calc Af Amer: >90 mL/min (ref >90)
Potassium: 3.2 mEq/L — ABNORMAL LOW (ref 3.5–5.1)

## 2013-11-29 LAB — URINE MICROSCOPIC-ADD ON

## 2013-11-29 LAB — TYPE AND SCREEN
ABO/RH(D): A POS
Antibody Screen: NEGATIVE

## 2013-11-29 LAB — URINALYSIS, ROUTINE W REFLEX MICROSCOPIC
Nitrite: POSITIVE — AB
Protein, ur: NEGATIVE mg/dL
Specific Gravity, Urine: 1.024 (ref 1.005–1.030)
Urobilinogen, UA: 0.2 mg/dL (ref 0.0–1.0)

## 2013-11-29 LAB — ABO/RH: ABO/RH(D): A POS

## 2013-11-29 MED ORDER — DEXTROSE 5 % IV SOLN
1.0000 g | Freq: Every day | INTRAVENOUS | Status: DC
  Administered 2013-11-30 – 2013-12-02 (×4): 1 g via INTRAVENOUS
  Filled 2013-11-29 (×5): qty 10

## 2013-11-29 MED ORDER — CAMPHOR-MENTHOL 0.5-0.5 % EX LOTN: 1.0000 "application " | TOPICAL_LOTION | CUTANEOUS | Status: DC | PRN

## 2013-11-29 MED ORDER — ONDANSETRON HCL 4 MG PO TABS: 4.0000 mg | ORAL_TABLET | Freq: Four times a day (QID) | ORAL | Status: DC | PRN

## 2013-11-29 MED ORDER — DOCUSATE SODIUM 100 MG PO CAPS
100.0000 mg | ORAL_CAPSULE | Freq: Two times a day (BID) | ORAL | Status: DC
  Administered 2013-11-30 (×2): 100 mg via ORAL
  Filled 2013-11-29 (×5): qty 1

## 2013-11-29 MED ORDER — SENNOSIDES-DOCUSATE SODIUM 8.6-50 MG PO TABS
2.0000 | ORAL_TABLET | Freq: Every day | ORAL | Status: DC
  Administered 2013-11-30 – 2013-12-01 (×2): 2 via ORAL
  Filled 2013-11-29 (×2): qty 2

## 2013-11-29 MED ORDER — CALCIUM CARBONATE-VITAMIN D 500-200 MG-UNIT PO TABS
1.0000 | ORAL_TABLET | Freq: Every day | ORAL | Status: DC
  Administered 2013-11-30 – 2013-12-03 (×3): 1 via ORAL
  Filled 2013-11-29 (×5): qty 1

## 2013-11-29 MED ORDER — MIRTAZAPINE 15 MG PO TABS
15.0000 mg | ORAL_TABLET | Freq: Every day | ORAL | Status: DC
  Administered 2013-11-30 – 2013-12-02 (×4): 15 mg via ORAL
  Filled 2013-11-29 (×5): qty 1

## 2013-11-29 MED ORDER — DONEPEZIL HCL 10 MG PO TABS
10.0000 mg | ORAL_TABLET | Freq: Every day | ORAL | Status: DC
  Administered 2013-11-30 – 2013-12-02 (×4): 10 mg via ORAL
  Filled 2013-11-29 (×5): qty 1

## 2013-11-29 MED ORDER — GABAPENTIN 300 MG PO CAPS
300.0000 mg | ORAL_CAPSULE | Freq: Three times a day (TID) | ORAL | Status: DC
  Administered 2013-11-30 – 2013-12-03 (×8): 300 mg via ORAL
  Filled 2013-11-29 (×12): qty 1

## 2013-11-29 MED ORDER — OXYCODONE-ACETAMINOPHEN 5-325 MG PO TABS
2.0000 | ORAL_TABLET | Freq: Two times a day (BID) | ORAL | Status: DC
  Administered 2013-11-30 – 2013-12-01 (×3): 2 via ORAL
  Administered 2013-12-02: 1 via ORAL
  Administered 2013-12-02: 2 via ORAL
  Filled 2013-11-29 (×5): qty 2

## 2013-11-29 MED ORDER — POLYVINYL ALCOHOL 1.4 % OP SOLN
1.0000 [drp] | OPHTHALMIC | Status: DC | PRN
  Filled 2013-11-29: qty 15

## 2013-11-29 MED ORDER — POLYETHYLENE GLYCOL 3350 17 G PO PACK
17.0000 g | PACK | Freq: Every day | ORAL | Status: DC
  Administered 2013-11-30 – 2013-12-02 (×3): 17 g via ORAL
  Filled 2013-11-29 (×4): qty 1

## 2013-11-29 MED ORDER — ONDANSETRON HCL 4 MG/2ML IJ SOLN: 4.0000 mg | Freq: Four times a day (QID) | INTRAMUSCULAR | Status: DC | PRN

## 2013-11-29 MED ORDER — DEXTROSE-NACL 5-0.9 % IV SOLN
INTRAVENOUS | Status: DC
  Administered 2013-11-30 – 2013-12-01 (×3): via INTRAVENOUS

## 2013-11-29 MED ORDER — PHYTONADIONE 5 MG PO TABS
10.0000 mg | ORAL_TABLET | Freq: Once | ORAL | Status: AC
  Administered 2013-11-30: 10 mg via ORAL
  Filled 2013-11-29: qty 2

## 2013-11-29 MED ORDER — POLYETHYL GLYCOL-PROPYL GLYCOL 0.4-0.3 % OP SOLN: 1.0000 [drp] | Freq: Three times a day (TID) | OPHTHALMIC | Status: DC

## 2013-11-29 MED ORDER — HYDROMORPHONE HCL PF 1 MG/ML IJ SOLN
1.0000 mg | INTRAMUSCULAR | Status: DC | PRN
  Administered 2013-11-30 (×3): 1 mg via INTRAVENOUS
  Filled 2013-11-29 (×3): qty 1

## 2013-11-29 MED ORDER — MEMANTINE HCL 10 MG PO TABS
10.0000 mg | ORAL_TABLET | Freq: Two times a day (BID) | ORAL | Status: DC
  Administered 2013-11-30 – 2013-12-03 (×7): 10 mg via ORAL
  Filled 2013-11-29 (×9): qty 1

## 2013-11-29 NOTE — ED Provider Notes (Signed)
CSN: [id number]     Arrival date & time 11/29/13  1940 History   First MD Initiated Contact with Patient 11/29/13 1957     Chief Complaint  Patient presents with  . Hip Injury   (Consider location/radiation/quality/duration/timing/severity/associated sxs/prior Treatment) The history is provided by the patient.   level V caveat due to dementia. Patient reportedly fell out of bed 5 days ago. Patient states it occurred yesterday. Reportedly had a negative x-ray initially followed by a repeat x-ray today that showed a left hip fracture. Patient is complaining of pain in her left hip and a headache. She is on Coumadin.  Past Medical History  Diagnosis Date  . Alzheimer's dementia   . Parkinson disease   . Depression   . Hypertension   . Arthritis   . Hyperlipemia   . Peripheral neuropathy   . Back pain   . Gout   . Constipation   . Hearing loss   . Insomnia    Past Surgical History  Procedure Laterality Date  . Total knee arthroplasty Bilateral    History reviewed. No pertinent family history. History  Substance Use Topics  . Smoking status: Former Smoker    Types: Cigarettes    Quit date: 11/29/1993  . Smokeless tobacco: Not on file  . Alcohol Use: No   OB History   Grav Para Term Preterm Abortions TAB SAB Ect Mult Living                 Review of Systems  Unable to perform ROS   Allergies  Bextra; Clindamycin/lincomycin; Codeine; Diclofenac; Methadone; Mobic; Naproxen; Penicillins; and Sulfa antibiotics  Home Medications   Current Outpatient Rx  Name  Route  Sig  Dispense  Refill  . betamethasone dipropionate (DIPROLENE) 0.05 % cream   Topical   Apply 1 application topically 2 (two) times daily.         . calcium-vitamin D (OSCAL WITH D) 500-200 MG-UNIT per tablet   Oral   Take 1 tablet by mouth daily with breakfast.         . camphor-menthol (SARNA) lotion   Topical   Apply 1 application topically as needed for itching.         . clotrimazole  (LOTRIMIN) 1 % cream   Topical   Apply 1 application topically 2 (two) times daily.         Marland Kitchen donepezil (ARICEPT) 10 MG tablet   Oral   Take 10 mg by mouth at bedtime.         Marland Kitchen ethacrynic acid (EDECRIN) 25 MG tablet   Oral   Take 25 mg by mouth daily.         Marland Kitchen gabapentin (NEURONTIN) 300 MG capsule   Oral   Take 300 mg by mouth 3 (three) times daily.         . hydrOXYzine (ATARAX/VISTARIL) 25 MG tablet   Oral   Take 25 mg by mouth every 12 (twelve) hours as needed for itching.         . lidocaine (LIDODERM) 5 %   Transdermal   Place 1 patch onto the skin daily. Remove & Discard patch within 12 hours or as directed by MD         . memantine (NAMENDA) 10 MG tablet   Oral   Take 10 mg by mouth 2 (two) times daily.         . mirtazapine (REMERON) 15 MG tablet   Oral   Take  15 mg by mouth at bedtime.         . nitroGLYCERIN (NITROSTAT) 0.4 MG SL tablet   Sublingual   Place 0.4 mg under the tongue every 5 (five) minutes as needed for chest pain.         Marland Kitchen oxyCODONE-acetaminophen (PERCOCET/ROXICET) 5-325 MG per tablet   Oral   Take 2 tablets by mouth 2 (two) times daily.         Bertram Gala Glycol-Propyl Glycol (SYSTANE) 0.4-0.3 % SOLN   Both Eyes   Place 1 drop into both eyes 3 (three) times daily.         . polyethylene glycol (MIRALAX / GLYCOLAX) packet   Oral   Take 17 g by mouth daily.         Marland Kitchen senna-docusate (SENOKOT-S) 8.6-50 MG per tablet   Oral   Take 2 tablets by mouth at bedtime.         Marland Kitchen warfarin (COUMADIN) 2 MG tablet   Oral   Take 2 mg by mouth daily.          BP 121/52  Pulse 94  Temp(Src) 98.2 F (36.8 C) (Oral)  Resp 13  Ht 5\' 7"  (1.702 m)  Wt 180 lb (81.647 kg)  BMI 28.19 kg/m2  SpO2 95% Physical Exam  Nursing note and vitals reviewed. Constitutional: She is oriented to person, place, and time. She appears well-developed and well-nourished.  HENT:  Head: Normocephalic and atraumatic.  Eyes: EOM are  normal. Pupils are equal, round, and reactive to light.  Neck: Normal range of motion. Neck supple.  Cardiovascular: Normal rate, regular rhythm and normal heart sounds.   No murmur heard. Pulmonary/Chest: Effort normal and breath sounds normal. No respiratory distress. She has no wheezes. She has no rales.  Abdominal: Soft. Bowel sounds are normal. She exhibits no distension. There is no tenderness. There is no rebound and no guarding.  Musculoskeletal: She exhibits tenderness.  Tenderness left hip. Pain with movement. Scars from bilateral knee replacements. Ecchymosis to left knee. Sensation intact over bilateral feet.  Neurological: She is alert and oriented to person, place, and time. No cranial nerve deficit.  Skin: Skin is warm and dry.  Psychiatric: She has a normal mood and affect. Her speech is normal.    ED Course  Procedures (including critical care time) Labs Review Labs Reviewed  BASIC METABOLIC PANEL - Abnormal; Notable for the following:    Potassium 3.2 (*)    Glucose, Bld 138 (*)    GFR calc non Af Amer 79 (*)    All other components within normal limits  PROTIME-INR - Abnormal; Notable for the following:    Prothrombin Time 34.5 (*)    INR 3.59 (*)    All other components within normal limits  CBC WITH DIFFERENTIAL  URINALYSIS, ROUTINE W REFLEX MICROSCOPIC  TYPE AND SCREEN  ABO/RH   Imaging Review Dg Chest 1 View  11/29/2013   *RADIOLOGY REPORT*  Clinical Data: Hip injury, recent fall  CHEST - 1 VIEW  Comparison: 04/25/2008  Findings: Large hiatal hernia.  Aortic atherosclerosis. Interstitial coarsening.  Mild left greater than right lung base opacity. Left lung granuloma.  No definite pleural effusion or pneumothorax. Incidental azygos fissure.  Osteopenia.  Cervical fusion hardware is partially imaged.  IMPRESSION: Large hiatal hernia.  Mild left greater than right lung base opacities; atelectasis/scarring (favored) versus infiltrate.  Interstitial prominence is  at least in part chronic.  Superimposed atypical/viral infection or interstitial edema not  excluded.   Original Report Authenticated By: Jearld Lesch, M.D.   Dg Hip Complete Left  11/29/2013   *RADIOLOGY REPORT*  Clinical Data: Left the hip pain status post recent fall  LEFT HIP - COMPLETE 2+ VIEW  Comparison: None.  Findings: Displaced left femoral neck fracture with the proximal/superior migration of the distal component.  The femoral head remains seated within the acetabulum and abducted in position. Diffuse osteopenia.  Sacrum partially obscured by overlying bowel. Degenerative changes of the lower lumbar spine.  IMPRESSION: Displaced left femoral neck fracture.   Original Report Authenticated By: Jearld Lesch, M.D.   Ct Head Wo Contrast  11/29/2013   CLINICAL DATA:  Fall.  Alzheimer's dementia.  EXAM: CT HEAD WITHOUT CONTRAST  TECHNIQUE: Contiguous axial images were obtained from the base of the skull through the vertex without intravenous contrast.  COMPARISON:  05/16/2008  FINDINGS: Sinuses/Soft tissues: Motion degradation, primarily on the 1st images. No definite soft tissue swelling identified.  Clear paranasal sinuses and mastoid air cells.  No skull fracture.  Intracranial: Cerebral atrophy and mild low density in the periventricular white matter likely related to small vessel disease. Ventriculomegaly which is favored to be related to cerebral atrophy. This is unchanged.  No acute infarct, mass lesion, intra-axial, or extra-axial fluid collection.  IMPRESSION: 1. Motion degraded exam. 2.  No acute intracranial abnormality. 3.  Cerebral atrophy and small vessel ischemic change.   Electronically Signed   By: Jeronimo Greaves M.D.   On: 11/29/2013 21:10   Dg Knee Complete 4 Views Left  11/29/2013   *RADIOLOGY REPORT*  Clinical Data: Left knee pain  LEFT KNEE - COMPLETE 4+ VIEW  Comparison: None.  Findings: Limited positioning due to a left femoral neck fracture described on the  contemporaneous left hip report. Status post left total knee arthroplasty.  No periprosthetic lucency.  No displaced acute fracture or dislocation appreciated.  No joint effusion. Osteopenia.  IMPRESSION: Limited positioning.  No definite fracture or dislocation of the left knee.  Status post left total knee arthroplasty.   Original Report Authenticated By: Jearld Lesch, M.D.    EKG Interpretation   None       Date: 11/29/2013  Rate: 95  Rhythm: normal sinus rhythm  QRS Axis: normal  Intervals: normal  ST/T Wave abnormalities: normal  Conduction Disutrbances:none  Narrative Interpretation:   Old EKG Reviewed: none available   MDM   1. Femoral neck fracture, left, closed, initial encounter    Patient with fall. Left femoral neck fracture. She is on Coumadin for DVT. Discussed with orthopedic surgery and she will likely be operated on on Monday or Wednesday. Will admit to internal medicine.    Juliet Rude. Rubin Payor, MD 11/29/13 2215

## 2013-11-29 NOTE — ED Notes (Signed)
IV therapy at bedside.

## 2013-11-29 NOTE — ED Notes (Signed)
Two attempts at IV access unsuccessful. Transport here for radiology. Another nurse will attempt access when she returns.

## 2013-11-29 NOTE — ED Notes (Addendum)
Patient returned from X-ray 

## 2013-11-29 NOTE — ED Notes (Signed)
Patient transported to Radiology 

## 2013-11-29 NOTE — ED Notes (Signed)
Fall on 11/25 at [facility]; xrays done showed no injury. Nurse noted today left leg shortening and rotation issues, so repeated xrays. Left femoral neck fracture. Sent to [facility] ED with xray CD.

## 2013-11-29 NOTE — ED Notes (Signed)
No successful IV access x2 nurses; IV team notified and they will attempt.

## 2013-11-29 NOTE — H&P (Signed)
[facility] History and Physical  Jenna Barber ZOX:[id number] DOB: 1929/02/13    PCP:  Jenna Barber.  Chief Complaint: left hip fracture.  HPI: Jenna Barber is an 79 y.o. female with hx of dementia (moderate), Parkinson's disease, HTN, arthritis, hyperlipidemia, gout, nursing home resident with DNR code status, fell about five days ago, brought to the ER as she has continued pain on her left hip. Outside Xray showed displaced trochanteric Fx of the humeral head.  She has been on therapeutic coumadin for history of DVT.  Her head CT was negative and her CXR showed atelectasis. She has had no fever, chills, or coughs.  She was seen in consultation with orthopedics, and Dr Valentino Hue planned to surgically fix her hip once INR is corrected.  She has a UA which showed evidence of a UTI.  Hospitalsit was asked to admit her for hip fx, and UTI.  Rewiew of Systems:  Constitutional: Negative for malaise, fever and chills. No significant weight loss or weight gain Eyes: Negative for eye pain, redness and discharge, diplopia, visual changes, or flashes of light. ENMT: Negative for ear pain, hoarseness, nasal congestion, sinus pressure and sore throat. No headaches; tinnitus, drooling, or problem swallowing. Cardiovascular: Negative for chest pain, palpitations, diaphoresis, dyspnea and peripheral edema. ; No orthopnea, PND Respiratory: Negative for cough, hemoptysis, wheezing and stridor. No pleuritic chestpain. Gastrointestinal: Negative for nausea, vomiting, diarrhea, constipation, abdominal pain, melena, blood in stool, hematemesis, jaundice and rectal bleeding.    Genitourinary: Negative for frequency, dysuria, incontinence,flank pain and hematuria; Musculoskeletal: Negative for back pain and neck pain. Negative for swelling and trauma.;  Skin: . Negative for pruritus, rash, abrasions, bruising and skin lesion.; ulcerations Neuro: Negative for headache, lightheadedness and neck stiffness.  Negative for weakness, altered level of consciousness , altered mental status, extremity weakness, burning feet, involuntary movement, seizure and syncope.  Psych: negative for anxiety, depression, insomnia, tearfulness, panic attacks, hallucinations, paranoia, suicidal or homicidal ideation    Past Medical History  Diagnosis Date  . Alzheimer's dementia   . Parkinson disease   . Depression   . Hypertension   . Arthritis   . Hyperlipemia   . Peripheral neuropathy   . Back pain   . Gout   . Constipation   . Hearing loss   . Insomnia     Past Surgical History  Procedure Laterality Date  . Total knee arthroplasty Bilateral     Medications:  HOME MEDS: Prior to Admission medications   Medication Sig Start Date End Date Taking? Authorizing Provider  betamethasone dipropionate (DIPROLENE) 0.05 % cream Apply 1 application topically 2 (two) times daily.   Yes Historical Provider, MD  calcium-vitamin D (OSCAL WITH D) 500-200 MG-UNIT per tablet Take 1 tablet by mouth daily with breakfast.   Yes Historical Provider, MD  camphor-menthol (SARNA) lotion Apply 1 application topically as needed for itching.   Yes Historical Provider, MD  clotrimazole (LOTRIMIN) 1 % cream Apply 1 application topically 2 (two) times daily.   Yes Historical Provider, MD  donepezil (ARICEPT) 10 MG tablet Take 10 mg by mouth at bedtime.   Yes Historical Provider, MD  ethacrynic acid (EDECRIN) 25 MG tablet Take 25 mg by mouth daily.   Yes Historical Provider, MD  gabapentin (NEURONTIN) 300 MG capsule Take 300 mg by mouth 3 (three) times daily.   Yes Historical Provider, MD  hydrOXYzine (ATARAX/VISTARIL) 25 MG tablet Take 25 mg by mouth every 12 (twelve) hours as needed for itching.  Yes Historical Provider, MD  lidocaine (LIDODERM) 5 % Place 1 patch onto the skin daily. Remove & Discard patch within 12 hours or as directed by MD   Yes Historical Provider, MD  memantine (NAMENDA) 10 MG tablet Take 10 mg by mouth 2  (two) times daily.   Yes Historical Provider, MD  mirtazapine (REMERON) 15 MG tablet Take 15 mg by mouth at bedtime.   Yes Historical Provider, MD  nitroGLYCERIN (NITROSTAT) 0.4 MG SL tablet Place 0.4 mg under the tongue every 5 (five) minutes as needed for chest pain.   Yes Historical Provider, MD  oxyCODONE-acetaminophen (PERCOCET/ROXICET) 5-325 MG per tablet Take 2 tablets by mouth 2 (two) times daily.   Yes Historical Provider, MD  Polyethyl Glycol-Propyl Glycol (SYSTANE) 0.4-0.3 % SOLN Place 1 drop into both eyes 3 (three) times daily.   Yes Historical Provider, MD  polyethylene glycol (MIRALAX / GLYCOLAX) packet Take 17 g by mouth daily.   Yes Historical Provider, MD  senna-docusate (SENOKOT-S) 8.6-50 MG per tablet Take 2 tablets by mouth at bedtime.   Yes Historical Provider, MD  warfarin (COUMADIN) 2 MG tablet Take 2 mg by mouth daily.   Yes Historical Provider, MD     Allergies:  Allergies  Allergen Reactions  . Bextra [Valdecoxib]   . Clindamycin/Lincomycin   . Codeine   . Diclofenac   . Methadone   . Mobic [Meloxicam]   . Naproxen   . Penicillins   . Sulfa Antibiotics     Social History:   reports that she quit smoking about 20 years ago. Her smoking use included Cigarettes. She smoked 0.00 packs per day. She does not have any smokeless tobacco history on file. She reports that she does not drink alcohol or use illicit drugs.  Family History: History reviewed. No pertinent family history.   Physical Exam: Filed Vitals:   11/29/13 1949 11/29/13 1950 11/29/13 2145  BP: 115/78  121/52  Pulse: 100  94  Temp: 98.2 F (36.8 C)    TempSrc: Oral    Resp: 18  13  Height:  5\' 7"  (1.702 m)   Weight:  81.647 kg (180 lb)   SpO2: 95%  95%   Blood pressure 121/52, pulse 94, temperature 98.2 F (36.8 C), temperature source Oral, resp. rate 13, height 5\' 7"  (1.702 m), weight 81.647 kg (180 lb), SpO2 95.00%.  GEN:  Pleasant  patient lying in the stretcher in no acute  distress; cooperative with exam. PSYCH:  alert and oriented x4; does not appear anxious or depressed; affect is appropriate. HEENT: Mucous membranes pink and anicteric; PERRLA; EOM intact; no cervical lymphadenopathy nor thyromegaly or carotid bruit; no JVD; There were no stridor. Neck is very supple. Breasts:: Not examined CHEST WALL: No tenderness CHEST: Normal respiration, clear to auscultation bilaterally.  HEART: Regular rate and rhythm.  There are no murmur, rub, or gallops.   BACK: No kyphosis or scoliosis; no CVA tenderness ABDOMEN: soft and non-tender; no masses, no organomegaly, normal abdominal bowel sounds; no pannus; no intertriginous candida. There is no rebound and no distention. Rectal Exam: Not done EXTREMITIES: No bone or joint deformity; age-appropriate arthropathy of the hands and knees; no edema; no ulcerations.  There is no calf tenderness.  Her left leg is shorter.  Her left hip painful. Genitalia: not examined PULSES: 2+ and symmetric SKIN: Normal hydration no rash or ulceration CNS: Cranial nerves 2-12 grossly intact no focal lateralizing neurologic deficit.  Speech is fluent; uvula elevated with phonation, facial  symmetry and tongue midline. DTR are normal bilaterally, cerebella exam is intact, barbinski is negative and strengths are equaled bilaterally.  No sensory loss.   Labs on Admission:  Basic Metabolic Panel:  Recent Labs Lab 11/29/13 2005  NA 138  K 3.2*  CL 100  CO2 29  GLUCOSE 138*  BUN 21  CREATININE 0.70  CALCIUM 8.9   Liver Function Tests: No results found for this basename: AST, ALT, ALKPHOS, BILITOT, PROT, ALBUMIN,  in the last 168 hours No results found for this basename: LIPASE, AMYLASE,  in the last 168 hours No results found for this basename: AMMONIA,  in the last 168 hours CBC:  Recent Labs Lab 11/29/13 2005  WBC 6.7  NEUTROABS 4.0  HGB 13.4  HCT 39.1  MCV 97.3  PLT 173   Cardiac Enzymes: No results found for this  basename: CKTOTAL, CKMB, CKMBINDEX, TROPONINI,  in the last 168 hours  CBG: No results found for this basename: GLUCAP,  in the last 168 hours   Radiological Exams on Admission: Dg Chest 1 View  11/29/2013   *RADIOLOGY REPORT*  Clinical Data: Hip injury, recent fall  CHEST - 1 VIEW  Comparison: 04/25/2008  Findings: Large hiatal hernia.  Aortic atherosclerosis. Interstitial coarsening.  Mild left greater than right lung base opacity. Left lung granuloma.  No definite pleural effusion or pneumothorax. Incidental azygos fissure.  Osteopenia.  Cervical fusion hardware is partially imaged.  IMPRESSION: Large hiatal hernia.  Mild left greater than right lung base opacities; atelectasis/scarring (favored) versus infiltrate.  Interstitial prominence is at least in part chronic.  Superimposed atypical/viral infection or interstitial edema not excluded.   Original Report Authenticated By: Jearld Lesch, M.D.   Dg Hip Complete Left  11/29/2013   *RADIOLOGY REPORT*  Clinical Data: Left the hip pain status post recent fall  LEFT HIP - COMPLETE 2+ VIEW  Comparison: None.  Findings: Displaced left femoral neck fracture with the proximal/superior migration of the distal component.  The femoral head remains seated within the acetabulum and abducted in position. Diffuse osteopenia.  Sacrum partially obscured by overlying bowel. Degenerative changes of the lower lumbar spine.  IMPRESSION: Displaced left femoral neck fracture.   Original Report Authenticated By: Jearld Lesch, M.D.   Ct Head Wo Contrast  11/29/2013   CLINICAL DATA:  Fall.  Alzheimer's dementia.  EXAM: CT HEAD WITHOUT CONTRAST  TECHNIQUE: Contiguous axial images were obtained from the base of the skull through the vertex without intravenous contrast.  COMPARISON:  05/16/2008  FINDINGS: Sinuses/Soft tissues: Motion degradation, primarily on the 1st images. No definite soft tissue swelling identified.  Clear paranasal sinuses and mastoid air cells.   No skull fracture.  Intracranial: Cerebral atrophy and mild low density in the periventricular white matter likely related to small vessel disease. Ventriculomegaly which is favored to be related to cerebral atrophy. This is unchanged.  No acute infarct, mass lesion, intra-axial, or extra-axial fluid collection.  IMPRESSION: 1. Motion degraded exam. 2.  No acute intracranial abnormality. 3.  Cerebral atrophy and small vessel ischemic change.   Electronically Signed   By: Jeronimo Greaves M.D.   On: 11/29/2013 21:10   Dg Knee Complete 4 Views Left  11/29/2013   *RADIOLOGY REPORT*  Clinical Data: Left knee pain  LEFT KNEE - COMPLETE 4+ VIEW  Comparison: None.  Findings: Limited positioning due to a left femoral neck fracture described on the contemporaneous left hip report. Status post left total knee arthroplasty.  No periprosthetic lucency.  No displaced acute fracture or dislocation appreciated.  No joint effusion. Osteopenia.  IMPRESSION: Limited positioning.  No definite fracture or dislocation of the left knee.  Status post left total knee arthroplasty.   Original Report Authenticated By: Jearld Lesch, M.D.   Assessment/Plan Present on Admission:  . Alzheimer's disease . Osteoarthrosis, unspecified whether generalized or localized, unspecified site . DNR (do not resuscitate) Left hip fracture. UTI.  PLAN:  Will admit her for left hip Fx, from mechanical fall 5 days ago.  Will give Vit K orally to reverse her coumadin anticipating surgery.  She also has a UTI and will be given IV Rocephin.  Accepting increase periOp cardiovascular  Complication, she is cleared for surgery.  I suspect her INR will be corrected shortly.  She is a DNR and will continue her code status.  She is otherwise stable, and will be admitted to [facility] service.  Thank you for allowing me to participate in her care.    Other plans as per orders.  Code Status: DNR.   Houston Siren, MD. [facility] Pager [phone] 7pm to  7am.  11/29/2013, 10:46 PM

## 2013-11-29 NOTE — Consult Note (Signed)
Reason for Consult:left hip fracture Referring Physician: hospitalists  Jenna Barber is an 79 y.o. female.  HPI: the patient is an 79 year old female who fell onto her left hip earlier tonight.  She complains of left hip pain.  She is admitted to the hospitalist service and we are consult for management of a left hip fracture.  The patient does have a significant history of being on Coumadin and currently has an elevated INR. Non ambulator who lives at [facility]  Past Medical History  Diagnosis Date  . Alzheimer's dementia   . Parkinson disease   . Depression   . Hypertension   . Arthritis   . Hyperlipemia   . Peripheral neuropathy   . Back pain   . Gout   . Constipation   . Hearing loss   . Insomnia     Past Surgical History  Procedure Laterality Date  . Total knee arthroplasty Bilateral     History reviewed. No pertinent family history.  Social History:  reports that she quit smoking about 20 years ago. Her smoking use included Cigarettes. She smoked 0.00 packs per day. She does not have any smokeless tobacco history on file. She reports that she does not drink alcohol or use illicit drugs.  Allergies:  Allergies  Allergen Reactions  . Bextra [Valdecoxib]   . Clindamycin/Lincomycin   . Codeine   . Diclofenac   . Methadone   . Mobic [Meloxicam]   . Naproxen   . Penicillins   . Sulfa Antibiotics     Medications: I have reviewed the patient's current medications.  Results for orders placed during the hospital encounter of 11/29/13 (from the past 48 hour(s))  BASIC METABOLIC PANEL     Status: Abnormal   Collection Time    11/29/13  8:05 PM      Result Value Range   Sodium 138  135 - 145 mEq/L   Potassium 3.2 (*) 3.5 - 5.1 mEq/L   Chloride 100  96 - 112 mEq/L   CO2 29  19 - 32 mEq/L   Glucose, Bld 138 (*) 70 - 99 mg/dL   BUN 21  6 - 23 mg/dL   Creatinine, Ser 4.09  0.50 - 1.10 mg/dL   Calcium 8.9  8.4 - 81.1 mg/dL   GFR calc non Af Amer 79 (*) >90 mL/min    GFR calc Af Amer >90  >90 mL/min   Comment: (NOTE)     The eGFR has been calculated using the CKD EPI equation.     This calculation has not been validated in all clinical situations.     eGFR's persistently <90 mL/min signify possible Chronic Kidney     Disease.  CBC WITH DIFFERENTIAL     Status: None   Collection Time    11/29/13  8:05 PM      Result Value Range   WBC 6.7  4.0 - 10.5 K/uL   RBC 4.02  3.87 - 5.11 MIL/uL   Hemoglobin 13.4  12.0 - 15.0 g/dL   HCT 91.4  78.2 - 95.6 %   MCV 97.3  78.0 - 100.0 fL   MCH 33.3  26.0 - 34.0 pg   MCHC 34.3  30.0 - 36.0 g/dL   RDW 21.3  08.6 - 57.8 %   Platelets 173  150 - 400 K/uL   Neutrophils Relative % 60  43 - 77 %   Neutro Abs 4.0  1.7 - 7.7 K/uL   Lymphocytes Relative 26  12 - 46 %   Lymphs Abs 1.8  0.7 - 4.0 K/uL   Monocytes Relative 9  3 - 12 %   Monocytes Absolute 0.6  0.1 - 1.0 K/uL   Eosinophils Relative 4  0 - 5 %   Eosinophils Absolute 0.3  0.0 - 0.7 K/uL   Basophils Relative 0  0 - 1 %   Basophils Absolute 0.0  0.0 - 0.1 K/uL  PROTIME-INR     Status: Abnormal   Collection Time    11/29/13  8:05 PM      Result Value Range   Prothrombin Time 34.5 (*) 11.6 - 15.2 seconds   INR 3.59 (*) 0.00 - 1.49  TYPE AND SCREEN     Status: None   Collection Time    11/29/13  8:20 PM      Result Value Range   ABO/RH(D) A POS     Antibody Screen NEG     Sample Expiration 12/02/2013    URINALYSIS, ROUTINE W REFLEX MICROSCOPIC     Status: Abnormal   Collection Time    11/29/13  9:36 PM      Result Value Range   Color, Urine YELLOW  YELLOW   APPearance CLEAR  CLEAR   Specific Gravity, Urine 1.024  1.005 - 1.030   pH 5.0  5.0 - 8.0   Glucose, UA NEGATIVE  NEGATIVE mg/dL   Hgb urine dipstick SMALL (*) NEGATIVE   Bilirubin Urine NEGATIVE  NEGATIVE   Ketones, ur NEGATIVE  NEGATIVE mg/dL   Protein, ur NEGATIVE  NEGATIVE mg/dL   Urobilinogen, UA 0.2  0.0 - 1.0 mg/dL   Nitrite POSITIVE (*) NEGATIVE   Leukocytes, UA LARGE (*)  NEGATIVE  URINE MICROSCOPIC-ADD ON     Status: Abnormal   Collection Time    11/29/13  9:36 PM      Result Value Range   Squamous Epithelial / LPF RARE  RARE   WBC, UA 21-50  <3 WBC/hpf   Bacteria, UA MANY (*) RARE    Dg Chest 1 View  11/29/2013   *RADIOLOGY REPORT*  Clinical Data: Hip injury, recent fall  CHEST - 1 VIEW  Comparison: 04/25/2008  Findings: Large hiatal hernia.  Aortic atherosclerosis. Interstitial coarsening.  Mild left greater than right lung base opacity. Left lung granuloma.  No definite pleural effusion or pneumothorax. Incidental azygos fissure.  Osteopenia.  Cervical fusion hardware is partially imaged.  IMPRESSION: Large hiatal hernia.  Mild left greater than right lung base opacities; atelectasis/scarring (favored) versus infiltrate.  Interstitial prominence is at least in part chronic.  Superimposed atypical/viral infection or interstitial edema not excluded.   Original Report Authenticated By: Jearld Lesch, M.D.   Dg Hip Complete Left  11/29/2013   *RADIOLOGY REPORT*  Clinical Data: Left the hip pain status post recent fall  LEFT HIP - COMPLETE 2+ VIEW  Comparison: None.  Findings: Displaced left femoral neck fracture with the proximal/superior migration of the distal component.  The femoral head remains seated within the acetabulum and abducted in position. Diffuse osteopenia.  Sacrum partially obscured by overlying bowel. Degenerative changes of the lower lumbar spine.  IMPRESSION: Displaced left femoral neck fracture.   Original Report Authenticated By: Jearld Lesch, M.D.   Ct Head Wo Contrast  11/29/2013   CLINICAL DATA:  Fall.  Alzheimer's dementia.  EXAM: CT HEAD WITHOUT CONTRAST  TECHNIQUE: Contiguous axial images were obtained from the base of the skull through the vertex without intravenous contrast.  COMPARISON:  05/16/2008  FINDINGS: Sinuses/Soft tissues: Motion degradation, primarily on the 1st images. No definite soft tissue swelling identified.   Clear paranasal sinuses and mastoid air cells.  No skull fracture.  Intracranial: Cerebral atrophy and mild low density in the periventricular white matter likely related to small vessel disease. Ventriculomegaly which is favored to be related to cerebral atrophy. This is unchanged.  No acute infarct, mass lesion, intra-axial, or extra-axial fluid collection.  IMPRESSION: 1. Motion degraded exam. 2.  No acute intracranial abnormality. 3.  Cerebral atrophy and small vessel ischemic change.   Electronically Signed   By: Jeronimo Greaves M.D.   On: 11/29/2013 21:10   Dg Knee Complete 4 Views Left  11/29/2013   *RADIOLOGY REPORT*  Clinical Data: Left knee pain  LEFT KNEE - COMPLETE 4+ VIEW  Comparison: None.  Findings: Limited positioning due to a left femoral neck fracture described on the contemporaneous left hip report. Status post left total knee arthroplasty.  No periprosthetic lucency.  No displaced acute fracture or dislocation appreciated.  No joint effusion. Osteopenia.  IMPRESSION: Limited positioning.  No definite fracture or dislocation of the left knee.  Status post left total knee arthroplasty.   Original Report Authenticated By: Jearld Lesch, M.D.    ROS ROS: I have reviewed the patient's review of systems thoroughly and there are no positive responses as relates to the HPI. Exam: Blood pressure 97/51, pulse 97, temperature 98.8 F (37.1 C), temperature source Oral, resp. rate 16, height 5\' 7"  (1.702 m), weight 81.647 kg (180 lb), SpO2 97.00%. Physical Exam Well-developed well-nourished patient in no acute distress. Alert and oriented x3 HEENT:within normal limits Cardiac: Regular rate and rhythm Pulmonary: Lungs clear to auscultation Abdomen: Soft and nontender.  Normal active bowel sounds  Musculoskeletal: left hip externally rotated and shortened.  There is pain with range of motion.  She is neurovascularly intact distally.tight achilles contracture Assessment/Plan: 79 year old  female with displaced femoral neck fracture and has multiple medical problems and an elevated INR.//The patient will be taken to the operating room for left hemiarthroplasty with an INR is reasonable which in my mind is below 2.5.  I am available to treat the patient Monday afternoon, Tuesday at 1 PM, and Wednesday after noon.  I will monitor the patient's INR to proceed with surgery once cleared and a reasonable INR of below 2.5.  Dionisios Ricci L 11/29/2013, 11:05 PM

## 2013-11-29 NOTE — ED Notes (Signed)
IV insertion attempted x3, 2RNs unsuccessful, IV team called.

## 2013-11-30 DIAGNOSIS — F0280 Dementia in other diseases classified elsewhere without behavioral disturbance: Secondary | ICD-10-CM

## 2013-11-30 DIAGNOSIS — I82409 Acute embolism and thrombosis of unspecified deep veins of unspecified lower extremity: Secondary | ICD-10-CM

## 2013-11-30 DIAGNOSIS — S72009A Fracture of unspecified part of neck of unspecified femur, initial encounter for closed fracture: Principal | ICD-10-CM

## 2013-11-30 LAB — PROTIME-INR: INR: 2.21 — ABNORMAL HIGH (ref 0.00–1.49)

## 2013-11-30 LAB — BASIC METABOLIC PANEL
BUN: 19 mg/dL (ref 6–23)
Calcium: 8.7 mg/dL (ref 8.4–10.5)
Creatinine, Ser: 0.49 mg/dL — ABNORMAL LOW (ref 0.50–1.10)
GFR calc Af Amer: >90 mL/min (ref >90)
GFR calc non Af Amer: 88 mL/min — ABNORMAL LOW (ref >90)
Glucose, Bld: 111 mg/dL — ABNORMAL HIGH (ref 70–99)

## 2013-11-30 MED ORDER — ENSURE COMPLETE PO LIQD
237.0000 mL | Freq: Two times a day (BID) | ORAL | Status: DC
  Administered 2013-12-02 – 2013-12-03 (×2): 237 mL via ORAL

## 2013-11-30 MED ORDER — CHLORHEXIDINE GLUCONATE 4 % EX LIQD
60.0000 mL | CUTANEOUS | Status: DC
  Filled 2013-11-30: qty 60

## 2013-11-30 MED ORDER — SODIUM CHLORIDE 0.9 % IV SOLN: INTRAVENOUS | Status: DC

## 2013-11-30 NOTE — Progress Notes (Signed)
[facility] PROGRESS NOTE  Jenna Barber WUJ:[id number] DOB: 03/29/1929 DOA: 11/29/2013 PCP: No primary provider on file.  Assessment/Plan: 1. Left hip fracture: - ortho consulted and plan for OR tomorrow.  - pain control   2. UTI: - cultures pending. On rocephin.   3. DVT; on coumadin. Which is being held for OR in am.  Therapeutic INR.  WILL restart coumadin after surgery.   4. DVT prophylaxis.   Code Status: DNR Family Communication: none at bedside Disposition Plan: remain inpatient.    Consultants:  Orthpedics.   Procedures:  none  Antibiotics:  Rocephin   HPI/Subjective: PAIN IS CONTROLLED.   Objective: Filed Vitals:   11/30/13 0846  BP: 106/45  Pulse: 88  Temp: 98.8 F (37.1 C)  Resp: 16    Intake/Output Summary (Last 24 hours) at 11/30/13 1517 Last data filed at 11/30/13 0910  Gross per 24 hour  Intake 191.25 ml  Output    150 ml  Net  41.25 ml   Filed Weights   11/29/13 1950  Weight: 81.647 kg (180 lb)    Exam:   General:  Alert afebrile comfortable  Cardiovascular: s1s2  Respiratory: ctab  Abdomen: soft NT ND BS  Musculoskeletal: painful ROM  Data Reviewed: Basic Metabolic Panel:  Recent Labs Lab 11/29/13 2005 11/30/13 1120  NA 138 142  K 3.2* 3.7  CL 100 105  CO2 29 22  GLUCOSE 138* 111*  BUN 21 19  CREATININE 0.70 0.49*  CALCIUM 8.9 8.7   Liver Function Tests: No results found for this basename: AST, ALT, ALKPHOS, BILITOT, PROT, ALBUMIN,  in the last 168 hours No results found for this basename: LIPASE, AMYLASE,  in the last 168 hours No results found for this basename: AMMONIA,  in the last 168 hours CBC:  Recent Labs Lab 11/29/13 2005  WBC 6.7  NEUTROABS 4.0  HGB 13.4  HCT 39.1  MCV 97.3  PLT 173   Cardiac Enzymes: No results found for this basename: CKTOTAL, CKMB, CKMBINDEX, TROPONINI,  in the last 168 hours BNP (last 3 results) No results found for this basename: PROBNP,  in the last 8760  hours CBG: No results found for this basename: GLUCAP,  in the last 168 hours  No results found for this or any previous visit (from the past 240 hour(s)).   Studies: Dg Chest 1 View  11/29/2013   *RADIOLOGY REPORT*  Clinical Data: Hip injury, recent fall  CHEST - 1 VIEW  Comparison: 04/25/2008  Findings: Large hiatal hernia.  Aortic atherosclerosis. Interstitial coarsening.  Mild left greater than right lung base opacity. Left lung granuloma.  No definite pleural effusion or pneumothorax. Incidental azygos fissure.  Osteopenia.  Cervical fusion hardware is partially imaged.  IMPRESSION: Large hiatal hernia.  Mild left greater than right lung base opacities; atelectasis/scarring (favored) versus infiltrate.  Interstitial prominence is at least in part chronic.  Superimposed atypical/viral infection or interstitial edema not excluded.   Original Report Authenticated By: Jearld Lesch, M.D.   Dg Hip Complete Left  11/29/2013   *RADIOLOGY REPORT*  Clinical Data: Left the hip pain status post recent fall  LEFT HIP - COMPLETE 2+ VIEW  Comparison: None.  Findings: Displaced left femoral neck fracture with the proximal/superior migration of the distal component.  The femoral head remains seated within the acetabulum and abducted in position. Diffuse osteopenia.  Sacrum partially obscured by overlying bowel. Degenerative changes of the lower lumbar spine.  IMPRESSION: Displaced left femoral neck fracture.  Original Report Authenticated By: Jearld Lesch, M.D.   Ct Head Wo Contrast  11/29/2013   CLINICAL DATA:  Fall.  Alzheimer's dementia.  EXAM: CT HEAD WITHOUT CONTRAST  TECHNIQUE: Contiguous axial images were obtained from the base of the skull through the vertex without intravenous contrast.  COMPARISON:  05/16/2008  FINDINGS: Sinuses/Soft tissues: Motion degradation, primarily on the 1st images. No definite soft tissue swelling identified.  Clear paranasal sinuses and mastoid air cells.  No skull  fracture.  Intracranial: Cerebral atrophy and mild low density in the periventricular white matter likely related to small vessel disease. Ventriculomegaly which is favored to be related to cerebral atrophy. This is unchanged.  No acute infarct, mass lesion, intra-axial, or extra-axial fluid collection.  IMPRESSION: 1. Motion degraded exam. 2.  No acute intracranial abnormality. 3.  Cerebral atrophy and small vessel ischemic change.   Electronically Signed   By: Jeronimo Greaves M.D.   On: 11/29/2013 21:10   Dg Knee Complete 4 Views Left  11/29/2013   *RADIOLOGY REPORT*  Clinical Data: Left knee pain  LEFT KNEE - COMPLETE 4+ VIEW  Comparison: None.  Findings: Limited positioning due to a left femoral neck fracture described on the contemporaneous left hip report. Status post left total knee arthroplasty.  No periprosthetic lucency.  No displaced acute fracture or dislocation appreciated.  No joint effusion. Osteopenia.  IMPRESSION: Limited positioning.  No definite fracture or dislocation of the left knee.  Status post left total knee arthroplasty.   Original Report Authenticated By: Jearld Lesch, M.D.    Scheduled Meds: . calcium-vitamin D  1 tablet Oral Q breakfast  . cefTRIAXone (ROCEPHIN)  IV  1 g Intravenous QHS  . [START ON 12/01/2013] chlorhexidine  60 mL Topical On Call to OR  . docusate sodium  100 mg Oral BID  . donepezil  10 mg Oral QHS  . gabapentin  300 mg Oral TID  . memantine  10 mg Oral BID  . mirtazapine  15 mg Oral QHS  . oxyCODONE-acetaminophen  2 tablet Oral BID  . polyethylene glycol  17 g Oral Daily  . senna-docusate  2 tablet Oral QHS   Continuous Infusions: . dextrose 5 % and 0.9% NaCl 75 mL/hr at 11/30/13 1405    Active Problems:   Long term (current) use of anticoagulants   Alzheimer's disease   Osteoarthrosis, unspecified whether generalized or localized, unspecified site   Femoral neck fracture   History of DVT (deep vein thrombosis)   DNR (do not  resuscitate)   Hip fracture   UTI (urinary tract infection)    Time spent:    [facility]  [facility] Pager [phone] If 7PM-7AM, please contact night-coverage at [email or web address], password [facility] 11/30/2013, 3:17 PM  LOS: 1 day

## 2013-11-30 NOTE — Progress Notes (Signed)
INITIAL NUTRITION ASSESSMENT  DOCUMENTATION CODES Per approved criteria  -Not Applicable   INTERVENTION: 1.  Supplements; Ensure Complete po BID, each supplement provides 350 kcal and 13 grams of protein.  NUTRITION DIAGNOSIS: Increased nutrient needs related to healing as evidenced by hip fx.   Monitor:  1.  Food/Beverage; pt meeting >/=90% estimated needs with tolerance. 2.  Wt/wt change; monitor trends  Reason for Assessment: MST  79 y.o. female  Admitting Dx: hip pain  ASSESSMENT: Pt with moderate dementia, Parkinson's, and arthritis admitted with hip pain s/p fall 5 days ago; found to have hip fx.  Plan is for surgery tomorrow.   Pt and daughter state eating well PTA with good appetite.  From NH.  Wt stable. RD to follow for resume of PO diet once medically appropriate and supplementation for increased nutrient needs.   Nutrition Focused Physical Exam: Subcutaneous Fat:  Orbital Region: WNL Upper Arm Region: WNL Thoracic and Lumbar Region: WNL  Muscle:  Temple Region: WNL Clavicle Bone Region: WNL Clavicle and Acromion Bone Region: WNL Scapular Bone Region: WNL Dorsal Hand: WNL Patellar Region: not assessed Anterior Thigh Region: not assessed Posterior Calf Region: not assessed  Edema: none present   Height: Ht Readings from Last 1 Encounters:  11/29/13 5\' 7"  (1.702 m)    Weight: Wt Readings from Last 1 Encounters:  11/29/13 180 lb (81.647 kg)    Ideal Body Weight: 135 lbs  % Ideal Body Weight: 133%  Wt Readings from Last 10 Encounters:  11/29/13 180 lb (81.647 kg)  08/07/13 171 lb (77.565 kg)  07/08/13 177 lb 11.2 oz (80.604 kg)  06/24/13 179 lb (81.194 kg)  06/17/13 179 lb (81.194 kg)  06/02/13 179 lb (81.194 kg)  05/26/13 180 lb (81.647 kg)  05/13/13 180 lb (81.647 kg)  05/12/13 180 lb (81.647 kg)  05/05/13 180 lb (81.647 kg)    Usual Body Weight: 180 lbs  % Usual Body Weight: 100%  BMI:  Body mass index is 28.19  kg/(m^2).  Estimated Nutritional Needs: Kcal: 1610-9604 Protein: 80-90g Fluid: ~2.0 L/day  Skin:  Stage 2 pressure ulcer  Diet Order: Dysphagia 3, thin  EDUCATION NEEDS: -No education needs identified at this time   Intake/Output Summary (Last 24 hours) at 11/30/13 1559 Last data filed at 11/30/13 0910  Gross per 24 hour  Intake 191.25 ml  Output    150 ml  Net  41.25 ml    Last BM: PTA  Labs:   Recent Labs Lab 11/29/13 2005 11/30/13 1120  NA 138 142  K 3.2* 3.7  CL 100 105  CO2 29 22  BUN 21 19  CREATININE 0.70 0.49*  CALCIUM 8.9 8.7  GLUCOSE 138* 111*    CBG (last 3)  No results found for this basename: GLUCAP,  in the last 72 hours  Scheduled Meds: . calcium-vitamin D  1 tablet Oral Q breakfast  . cefTRIAXone (ROCEPHIN)  IV  1 g Intravenous QHS  . [START ON 12/01/2013] chlorhexidine  60 mL Topical On Call to OR  . docusate sodium  100 mg Oral BID  . donepezil  10 mg Oral QHS  . gabapentin  300 mg Oral TID  . memantine  10 mg Oral BID  . mirtazapine  15 mg Oral QHS  . oxyCODONE-acetaminophen  2 tablet Oral BID  . polyethylene glycol  17 g Oral Daily  . senna-docusate  2 tablet Oral QHS    Continuous Infusions: . dextrose 5 % and 0.9% NaCl 75  mL/hr at 11/30/13 1405    Past Medical History  Diagnosis Date  . Alzheimer's dementia   . Parkinson disease   . Depression   . Hypertension   . Arthritis   . Hyperlipemia   . Peripheral neuropathy   . Back pain   . Gout   . Constipation   . Hearing loss   . Insomnia     Past Surgical History  Procedure Laterality Date  . Total knee arthroplasty Bilateral     Loyce Dys, MS RD LDN Clinical Inpatient Dietitian Pager: [phone] Weekend/After hours pager: [phone]

## 2013-12-01 ENCOUNTER — Inpatient Hospital Stay (HOSPITAL_COMMUNITY): Payer: Medicare Other | Admitting: Anesthesiology

## 2013-12-01 ENCOUNTER — Encounter (HOSPITAL_COMMUNITY): Payer: Self-pay | Admitting: Anesthesiology

## 2013-12-01 ENCOUNTER — Encounter (HOSPITAL_COMMUNITY): Payer: Medicare Other | Admitting: Anesthesiology

## 2013-12-01 ENCOUNTER — Encounter (HOSPITAL_COMMUNITY)
Admission: EM | Disposition: A | Discharge status: Skilled Nursing Facility | Payer: Self-pay | Source: Home / Self Care | Attending: Internal Medicine

## 2013-12-01 ENCOUNTER — Inpatient Hospital Stay (HOSPITAL_COMMUNITY): Payer: Medicare Other

## 2013-12-01 HISTORY — PX: HIP ARTHROPLASTY: SHX981

## 2013-12-01 LAB — CBC
HCT: 38.6 % (ref 36.0–46.0)
MCHC: 33.2 g/dL (ref 30.0–36.0)
MCV: 99.7 fL (ref 78.0–100.0)
RDW: 15.4 % (ref 11.5–15.5)

## 2013-12-01 SURGERY — HEMIARTHROPLASTY, HIP, DIRECT ANTERIOR APPROACH, FOR FRACTURE
Anesthesia: General | Site: Hip | Laterality: Left

## 2013-12-01 SURGERY — HEMIARTHROPLASTY, HIP, DIRECT ANTERIOR APPROACH, FOR FRACTURE
Anesthesia: Choice | Laterality: Left

## 2013-12-01 MED ORDER — LIDOCAINE HCL (CARDIAC) 20 MG/ML IV SOLN
INTRAVENOUS | Status: DC | PRN
  Administered 2013-12-01: 60 mg via INTRAVENOUS

## 2013-12-01 MED ORDER — FENTANYL CITRATE 0.05 MG/ML IJ SOLN
25.0000 ug | INTRAMUSCULAR | Status: DC | PRN
  Administered 2013-12-01 (×2): 50 ug via INTRAVENOUS

## 2013-12-01 MED ORDER — CEFAZOLIN SODIUM-DEXTROSE 2-3 GM-% IV SOLR
INTRAVENOUS | Status: AC
  Filled 2013-12-01: qty 50

## 2013-12-01 MED ORDER — ONDANSETRON HCL 4 MG PO TABS: 4.0000 mg | ORAL_TABLET | Freq: Four times a day (QID) | ORAL | Status: DC | PRN

## 2013-12-01 MED ORDER — FENTANYL CITRATE 0.05 MG/ML IJ SOLN
INTRAMUSCULAR | Status: AC
  Administered 2013-12-01: 50 ug via INTRAVENOUS
  Filled 2013-12-01: qty 2

## 2013-12-01 MED ORDER — ACETAMINOPHEN 325 MG PO TABS
650.0000 mg | ORAL_TABLET | Freq: Four times a day (QID) | ORAL | Status: DC | PRN
  Administered 2013-12-02 – 2013-12-03 (×3): 650 mg via ORAL
  Filled 2013-12-01 (×4): qty 2

## 2013-12-01 MED ORDER — GLYCOPYRROLATE 0.2 MG/ML IJ SOLN
INTRAMUSCULAR | Status: DC | PRN
  Administered 2013-12-01: 0.3 mg via INTRAVENOUS
  Administered 2013-12-01: .3 mg via INTRAVENOUS

## 2013-12-01 MED ORDER — BUPIVACAINE-EPINEPHRINE (PF) 0.5% -1:200000 IJ SOLN
INTRAMUSCULAR | Status: AC
  Filled 2013-12-01: qty 10

## 2013-12-01 MED ORDER — ACETAMINOPHEN 325 MG PO TABS: 650.0000 mg | ORAL_TABLET | Freq: Four times a day (QID) | ORAL | Status: AC | PRN

## 2013-12-01 MED ORDER — ALUM & MAG HYDROXIDE-SIMETH 200-200-20 MG/5ML PO SUSP: 30.0000 mL | ORAL | Status: DC | PRN

## 2013-12-01 MED ORDER — ONDANSETRON HCL 4 MG/2ML IJ SOLN: 4.0000 mg | Freq: Once | INTRAMUSCULAR | Status: DC | PRN

## 2013-12-01 MED ORDER — PROPOFOL 10 MG/ML IV BOLUS
INTRAVENOUS | Status: DC | PRN
  Administered 2013-12-01: 130 mg via INTRAVENOUS

## 2013-12-01 MED ORDER — BUPIVACAINE-EPINEPHRINE 0.5% -1:200000 IJ SOLN
INTRAMUSCULAR | Status: DC | PRN
  Administered 2013-12-01: 20 mL

## 2013-12-01 MED ORDER — ACETAMINOPHEN 650 MG RE SUPP: 650.0000 mg | Freq: Four times a day (QID) | RECTAL | Status: DC | PRN

## 2013-12-01 MED ORDER — SODIUM CHLORIDE 0.9 % IR SOLN
Status: DC | PRN
  Administered 2013-12-01: 1000 mL

## 2013-12-01 MED ORDER — CEFAZOLIN SODIUM-DEXTROSE 2-3 GM-% IV SOLR
2.0000 g | Freq: Once | INTRAVENOUS | Status: AC
  Administered 2013-12-01: 2 g via INTRAVENOUS

## 2013-12-01 MED ORDER — LACTATED RINGERS IV SOLN
INTRAVENOUS | Status: DC
  Administered 2013-12-01 (×2): via INTRAVENOUS

## 2013-12-01 MED ORDER — FENTANYL CITRATE 0.05 MG/ML IJ SOLN
INTRAMUSCULAR | Status: DC | PRN
  Administered 2013-12-01 (×3): 50 ug via INTRAVENOUS
  Administered 2013-12-01: 100 ug via INTRAVENOUS

## 2013-12-01 MED ORDER — DOCUSATE SODIUM 100 MG PO CAPS
100.0000 mg | ORAL_CAPSULE | Freq: Two times a day (BID) | ORAL | Status: DC
  Administered 2013-12-01 – 2013-12-03 (×4): 100 mg via ORAL
  Filled 2013-12-01 (×5): qty 1

## 2013-12-01 MED ORDER — CEFTRIAXONE SODIUM 1 G IJ SOLR
1.0000 g | INTRAMUSCULAR | Status: DC
  Filled 2013-12-01: qty 10

## 2013-12-01 MED ORDER — NEOSTIGMINE METHYLSULFATE 1 MG/ML IJ SOLN
INTRAMUSCULAR | Status: DC | PRN
  Administered 2013-12-01 (×2): 2 mg via INTRAVENOUS

## 2013-12-01 MED ORDER — LABETALOL HCL 5 MG/ML IV SOLN
INTRAVENOUS | Status: DC | PRN
  Administered 2013-12-01: 2.5 mg via INTRAVENOUS

## 2013-12-01 MED ORDER — ROCURONIUM BROMIDE 100 MG/10ML IV SOLN
INTRAVENOUS | Status: DC | PRN
  Administered 2013-12-01: 50 mg via INTRAVENOUS

## 2013-12-01 MED ORDER — ONDANSETRON HCL 4 MG/2ML IJ SOLN
INTRAMUSCULAR | Status: DC | PRN
  Administered 2013-12-01: 4 mg via INTRAVENOUS

## 2013-12-01 MED ORDER — ONDANSETRON HCL 4 MG/2ML IJ SOLN: 4.0000 mg | Freq: Four times a day (QID) | INTRAMUSCULAR | Status: DC | PRN

## 2013-12-01 SURGICAL SUPPLY — 55 items
BLADE SAW SAG 73X25 THK (BLADE) ×1
BLADE SAW SGTL 73X25 THK (BLADE) ×1 IMPLANT
BRUSH FEMORAL CANAL (MISCELLANEOUS) IMPLANT
CAPT HIP FX BIPOLAR/UNIPOLAR ×2 IMPLANT
CLOTH BEACON ORANGE TIMEOUT ST (SAFETY) IMPLANT
COVER BACK TABLE 24X17X13 BIG (DRAPES) IMPLANT
DRAPE ORTHO SPLIT 77X108 STRL (DRAPES) ×2
DRAPE PROXIMA HALF (DRAPES) ×2 IMPLANT
DRAPE SURG ORHT 6 SPLT 77X108 (DRAPES) ×2 IMPLANT
DRAPE U-SHAPE 47X51 STRL (DRAPES) ×2 IMPLANT
DRILL BIT 7/64X5 (BIT) ×2 IMPLANT
DRSG MEPILEX BORDER 4X12 (GAUZE/BANDAGES/DRESSINGS) ×2 IMPLANT
DRSG PAD ABDOMINAL 8X10 ST (GAUZE/BANDAGES/DRESSINGS) IMPLANT
DURAPREP 26ML APPLICATOR (WOUND CARE) ×2 IMPLANT
ELECT BLADE 6.5 EXT (BLADE) IMPLANT
ELECT CAUTERY BLADE 6.4 (BLADE) ×2 IMPLANT
ELECT REM PT RETURN 9FT ADLT (ELECTROSURGICAL) ×2
ELECTRODE REM PT RTRN 9FT ADLT (ELECTROSURGICAL) ×1 IMPLANT
EVACUATOR 1/8 PVC DRAIN (DRAIN) IMPLANT
FACESHIELD LNG OPTICON STERILE (SAFETY) ×2 IMPLANT
GAUZE XEROFORM 5X9 LF (GAUZE/BANDAGES/DRESSINGS) IMPLANT
GLOVE BIOGEL PI IND STRL 8 (GLOVE) ×2 IMPLANT
GLOVE BIOGEL PI INDICATOR 8 (GLOVE) ×2
GLOVE ECLIPSE 7.5 STRL STRAW (GLOVE) ×4 IMPLANT
GOWN PREVENTION PLUS XLARGE (GOWN DISPOSABLE) ×4 IMPLANT
GOWN SRG XL XLNG 56XLVL 4 (GOWN DISPOSABLE) ×1 IMPLANT
GOWN STRL NON-REIN LRG LVL3 (GOWN DISPOSABLE) ×2 IMPLANT
GOWN STRL NON-REIN XL XLG LVL4 (GOWN DISPOSABLE) ×1
HANDPIECE INTERPULSE COAX TIP (DISPOSABLE)
IMMOBILIZER KNEE 20 (SOFTGOODS) ×2
IMMOBILIZER KNEE 20 THIGH 36 (SOFTGOODS) ×1 IMPLANT
KIT BASIN OR (CUSTOM PROCEDURE TRAY) ×2 IMPLANT
KIT ROOM TURNOVER OR (KITS) ×2 IMPLANT
MANIFOLD NEPTUNE II (INSTRUMENTS) ×2 IMPLANT
NEEDLE 1/2 CIR MAYO (NEEDLE) IMPLANT
NS IRRIG 1000ML POUR BTL (IV SOLUTION) ×2 IMPLANT
PACK TOTAL JOINT (CUSTOM PROCEDURE TRAY) ×2 IMPLANT
PAD ARMBOARD 7.5X6 YLW CONV (MISCELLANEOUS) ×4 IMPLANT
PASSER SUT SWANSON 36MM LOOP (INSTRUMENTS) ×2 IMPLANT
SET HNDPC FAN SPRY TIP SCT (DISPOSABLE) IMPLANT
SPONGE GAUZE 4X4 12PLY (GAUZE/BANDAGES/DRESSINGS) IMPLANT
SUCTION FRAZIER TIP 10 FR DISP (SUCTIONS) IMPLANT
SUT ETHIBOND 2 V 37 (SUTURE) ×2 IMPLANT
SUT PASSER 2.0 195M (MISCELLANEOUS) ×2 IMPLANT
SUT VIC AB 0 CT1 27 (SUTURE) ×2
SUT VIC AB 0 CT1 27XBRD ANBCTR (SUTURE) ×2 IMPLANT
SUT VIC AB 1 CTX 36 (SUTURE) ×2
SUT VIC AB 1 CTX36XBRD ANBCTR (SUTURE) ×2 IMPLANT
SUT VIC AB 2-0 CT1 36 (SUTURE) ×4 IMPLANT
SYR CONTROL 10ML LL (SYRINGE) ×2 IMPLANT
TOWEL OR 17X24 6PK STRL BLUE (TOWEL DISPOSABLE) ×2 IMPLANT
TOWEL OR 17X26 10 PK STRL BLUE (TOWEL DISPOSABLE) ×2 IMPLANT
TOWER CARTRIDGE SMART MIX (DISPOSABLE) IMPLANT
TRAY FOLEY CATH 16FRSI W/METER (SET/KITS/TRAYS/PACK) IMPLANT
WATER STERILE IRR 1000ML POUR (IV SOLUTION) IMPLANT

## 2013-12-01 NOTE — Progress Notes (Signed)
ANTICOAGULATION CONSULT NOTE - Initial Consult  Pharmacy Consult for coumadin Indication: VTE prophylaxis, h/o DVT  Allergies  Allergen Reactions  . Bextra [Valdecoxib]   . Clindamycin/Lincomycin   . Codeine   . Diclofenac   . Methadone   . Mobic [Meloxicam]   . Naproxen   . Penicillins   . Sulfa Antibiotics     Patient Measurements: Height: 5\' 7"  (170.2 cm) Weight: 180 lb (81.647 kg) IBW/kg (Calculated) : 61.6  Vital Signs: Temp: 99 F (37.2 C) (12/02 1650) Temp src: Axillary (12/02 1650) BP: 126/64 mmHg (12/02 1650) Pulse Rate: 90 (12/02 1650)  Labs:  Recent Labs  11/29/13 2005 11/30/13 1120 12/01/13 1010  HGB 13.4  --  12.8  HCT 39.1  --  38.6  PLT 173  --  160  LABPROT 34.5* 23.8* 14.6  INR 3.59* 2.21* 1.16  CREATININE 0.70 0.49*  --     Estimated Creatinine Clearance: 59.6 ml/min (by C-G formula based on Cr of 0.49).   Medical History: Past Medical History  Diagnosis Date  . Alzheimer's dementia   . Parkinson disease   . Depression   . Hypertension   . Arthritis   . Hyperlipemia   . Peripheral neuropathy   . Back pain   . Gout   . Constipation   . Hearing loss   . Insomnia     Medications:  Prescriptions prior to admission  Medication Sig Dispense Refill  . betamethasone dipropionate (DIPROLENE) 0.05 % cream Apply 1 application topically 2 (two) times daily.      . calcium-vitamin D (OSCAL WITH D) 500-200 MG-UNIT per tablet Take 1 tablet by mouth daily with breakfast.      . camphor-menthol (SARNA) lotion Apply 1 application topically as needed for itching.      . clotrimazole (LOTRIMIN) 1 % cream Apply 1 application topically 2 (two) times daily.      Marland Kitchen donepezil (ARICEPT) 10 MG tablet Take 10 mg by mouth at bedtime.      Marland Kitchen ethacrynic acid (EDECRIN) 25 MG tablet Take 25 mg by mouth daily.      Marland Kitchen gabapentin (NEURONTIN) 300 MG capsule Take 300 mg by mouth 3 (three) times daily.      . hydrOXYzine (ATARAX/VISTARIL) 25 MG tablet Take 25 mg  by mouth every 12 (twelve) hours as needed for itching.      . lidocaine (LIDODERM) 5 % Place 1 patch onto the skin daily. Remove & Discard patch within 12 hours or as directed by MD      . memantine (NAMENDA) 10 MG tablet Take 10 mg by mouth 2 (two) times daily.      . mirtazapine (REMERON) 15 MG tablet Take 15 mg by mouth at bedtime.      . nitroGLYCERIN (NITROSTAT) 0.4 MG SL tablet Place 0.4 mg under the tongue every 5 (five) minutes as needed for chest pain.      Marland Kitchen oxyCODONE-acetaminophen (PERCOCET/ROXICET) 5-325 MG per tablet Take 2 tablets by mouth 2 (two) times daily.      Bertram Gala Glycol-Propyl Glycol (SYSTANE) 0.4-0.3 % SOLN Place 1 drop into both eyes 3 (three) times daily.      . polyethylene glycol (MIRALAX / GLYCOLAX) packet Take 17 g by mouth daily.      Marland Kitchen senna-docusate (SENOKOT-S) 8.6-50 MG per tablet Take 2 tablets by mouth at bedtime.      Marland Kitchen warfarin (COUMADIN) 2 MG tablet Take 2 mg by mouth daily.  Scheduled:  . calcium-vitamin D  1 tablet Oral Q breakfast  . cefTRIAXone (ROCEPHIN)  IV  1 g Intravenous QHS  . docusate sodium  100 mg Oral BID  . donepezil  10 mg Oral QHS  . feeding supplement (ENSURE COMPLETE)  237 mL Oral BID BM  . gabapentin  300 mg Oral TID  . memantine  10 mg Oral BID  . mirtazapine  15 mg Oral QHS  . oxyCODONE-acetaminophen  2 tablet Oral BID  . polyethylene glycol  17 g Oral Daily  . senna-docusate  2 tablet Oral QHS    Assessment: 79 yo female with L hip fracture s/p repair today. Patient on coumadin PTA for history of DVT and to restart coumadin today.  INR= 1.16 (s/p 10mg  po vitamin K given 11/30/13; INR on admission was 3.59 on 11/29/13).  Home coumadin dose: 2mg /day (last dose 11/29/13)  Goal of Therapy:  INR 2-3 Monitor platelets by anticoagulation protocol: Yes   Plan:  -Due to recent vitamin K and possible coumadin resistance will give coumadin 2mg  po today  -Consider adding lovenox with history of DVT?  Harland German, Pharm  D 12/01/2013 5:31 PM

## 2013-12-01 NOTE — Progress Notes (Signed)
[facility] PROGRESS NOTE  Jenna Barber ZOX:[id number] DOB: [birth year] DOA: 11/29/2013 PCP: No primary provider on file.  Assessment/Plan: 1. Left hip fracture: - ortho consulted and plan for OR today. - pain control   2. UTI: - cultures pending. On rocephin.   3. DVT; on coumadin. Which is being held for OR in am.  Therapeutic INR.  WILL restart coumadin after surgery.   4. DVT prophylaxis.   Code Status: DNR Family Communication: none at bedside Disposition Plan: remain inpatient.    Consultants:  Orthpedics.   Procedures:  none  Antibiotics:  Rocephin   HPI/Subjective: PAIN IS CONTROLLED.   Objective: Filed Vitals:   12/01/13 0541  BP: 114/57  Pulse: 85  Temp: 98.4 F (36.9 C)  Resp: 18    Intake/Output Summary (Last 24 hours) at 12/01/13 1041 Last data filed at 12/01/13 0600  Gross per 24 hour  Intake   2075 ml  Output    450 ml  Net   1625 ml   Filed Weights   11/29/13 1950  Weight: 81.647 kg (180 lb)    Exam:   General:  Alert afebrile comfortable  Cardiovascular: s1s2  Respiratory: ctab  Abdomen: soft NT ND BS  Musculoskeletal: painful ROM  Data Reviewed: Basic Metabolic Panel:  Recent Labs Lab 11/29/13 2005 11/30/13 1120  NA 138 142  K 3.2* 3.7  CL 100 105  CO2 29 22  GLUCOSE 138* 111*  BUN 21 19  CREATININE 0.70 0.49*  CALCIUM 8.9 8.7   Liver Function Tests: No results found for this basename: AST, ALT, ALKPHOS, BILITOT, PROT, ALBUMIN,  in the last 168 hours No results found for this basename: LIPASE, AMYLASE,  in the last 168 hours No results found for this basename: AMMONIA,  in the last 168 hours CBC:  Recent Labs Lab 11/29/13 2005  WBC 6.7  NEUTROABS 4.0  HGB 13.4  HCT 39.1  MCV 97.3  PLT 173   Cardiac Enzymes: No results found for this basename: CKTOTAL, CKMB, CKMBINDEX, TROPONINI,  in the last 168 hours BNP (last 3 results) No results found for this basename: PROBNP,  in the last 8760  hours CBG: No results found for this basename: GLUCAP,  in the last 168 hours  No results found for this or any previous visit (from the past 240 hour(s)).   Studies: Dg Chest 1 View  11/29/2013   *RADIOLOGY REPORT*  Clinical Data: Hip injury, recent fall  CHEST - 1 VIEW  Comparison: 04/25/2008  Findings: Large hiatal hernia.  Aortic atherosclerosis. Interstitial coarsening.  Mild left greater than right lung base opacity. Left lung granuloma.  No definite pleural effusion or pneumothorax. Incidental azygos fissure.  Osteopenia.  Cervical fusion hardware is partially imaged.  IMPRESSION: Large hiatal hernia.  Mild left greater than right lung base opacities; atelectasis/scarring (favored) versus infiltrate.  Interstitial prominence is at least in part chronic.  Superimposed atypical/viral infection or interstitial edema not excluded.   Original Report Authenticated By: Jearld Lesch, M.D.   Dg Hip Complete Left  11/29/2013   *RADIOLOGY REPORT*  Clinical Data: Left the hip pain status post recent fall  LEFT HIP - COMPLETE 2+ VIEW  Comparison: None.  Findings: Displaced left femoral neck fracture with the proximal/superior migration of the distal component.  The femoral head remains seated within the acetabulum and abducted in position. Diffuse osteopenia.  Sacrum partially obscured by overlying bowel. Degenerative changes of the lower lumbar spine.  IMPRESSION: Displaced left femoral neck  fracture.   Original Report Authenticated By: Jearld Lesch, M.D.   Ct Head Wo Contrast  11/29/2013   CLINICAL DATA:  Fall.  Alzheimer's dementia.  EXAM: CT HEAD WITHOUT CONTRAST  TECHNIQUE: Contiguous axial images were obtained from the base of the skull through the vertex without intravenous contrast.  COMPARISON:  05/16/2008  FINDINGS: Sinuses/Soft tissues: Motion degradation, primarily on the 1st images. No definite soft tissue swelling identified.  Clear paranasal sinuses and mastoid air cells.  No skull  fracture.  Intracranial: Cerebral atrophy and mild low density in the periventricular white matter likely related to small vessel disease. Ventriculomegaly which is favored to be related to cerebral atrophy. This is unchanged.  No acute infarct, mass lesion, intra-axial, or extra-axial fluid collection.  IMPRESSION: 1. Motion degraded exam. 2.  No acute intracranial abnormality. 3.  Cerebral atrophy and small vessel ischemic change.   Electronically Signed   By: Jeronimo Greaves M.D.   On: 11/29/2013 21:10   Dg Knee Complete 4 Views Left  11/29/2013   *RADIOLOGY REPORT*  Clinical Data: Left knee pain  LEFT KNEE - COMPLETE 4+ VIEW  Comparison: None.  Findings: Limited positioning due to a left femoral neck fracture described on the contemporaneous left hip report. Status post left total knee arthroplasty.  No periprosthetic lucency.  No displaced acute fracture or dislocation appreciated.  No joint effusion. Osteopenia.  IMPRESSION: Limited positioning.  No definite fracture or dislocation of the left knee.  Status post left total knee arthroplasty.   Original Report Authenticated By: Jearld Lesch, M.D.    Scheduled Meds: . calcium-vitamin D  1 tablet Oral Q breakfast  . cefTRIAXone (ROCEPHIN)  IV  1 g Intravenous QHS  . chlorhexidine  60 mL Topical On Call to OR  . docusate sodium  100 mg Oral BID  . donepezil  10 mg Oral QHS  . feeding supplement (ENSURE COMPLETE)  237 mL Oral BID BM  . gabapentin  300 mg Oral TID  . memantine  10 mg Oral BID  . mirtazapine  15 mg Oral QHS  . oxyCODONE-acetaminophen  2 tablet Oral BID  . polyethylene glycol  17 g Oral Daily  . senna-docusate  2 tablet Oral QHS   Continuous Infusions: . dextrose 5 % and 0.9% NaCl 75 mL/hr at 12/01/13 0600    Active Problems:   Long term (current) use of anticoagulants   Alzheimer's disease   Osteoarthrosis, unspecified whether generalized or localized, unspecified site   Femoral neck fracture   History of DVT (deep vein  thrombosis)   DNR (do not resuscitate)   Hip fracture   UTI (urinary tract infection)    Time spent:    [facility]  [facility] Pager [phone] If 7PM-7AM, please contact night-coverage at [email or web address], password [facility] 12/01/2013, 10:41 AM  LOS: 2 days

## 2013-12-01 NOTE — Anesthesia Postprocedure Evaluation (Signed)
Anesthesia Post Note  Patient: Jenna Barber  Procedure(s) Performed: Procedure(s) (LRB): Hemi-Arthroplasty Left Hip (Left)  Anesthesia type: General  Patient location: PACU  Post pain: Pain level controlled and Adequate analgesia  Post assessment: Post-op Vital signs reviewed, Patient's Cardiovascular Status Stable, Respiratory Function Stable, Patent Airway and Pain level controlled  Last Vitals:  Filed Vitals:   12/01/13 1600  BP: 127/80  Pulse: 103  Temp:   Resp: 16    Post vital signs: Reviewed and stable  Level of consciousness: awake, alert  and oriented  Complications: No apparent anesthesia complications

## 2013-12-01 NOTE — Progress Notes (Signed)
Subjective: C/o left hip pain. Objective: Vital signs in last 24 hours: Temp:  [98.1 F (36.7 C)-98.4 F (36.9 C)] 98.4 F (36.9 C) (12/02 0541) Pulse Rate:  [85-91] 85 (12/02 0541) Resp:  [15-18] 18 (12/02 0541) BP: (107-144)/(45-57) 114/57 mmHg (12/02 0541) SpO2:  [97 %-99 %] 99 % (12/02 0541)  Intake/Output from previous day: 12/01 0701 - 12/02 0700 In: 2075 [I.V.:2025; IV Piggyback:50] Out: 450 [Urine:450] Intake/Output this shift:     Recent Labs  11/29/13 2005  HGB 13.4    Recent Labs  11/29/13 2005  WBC 6.7  RBC 4.02  HCT 39.1  PLT 173    Recent Labs  11/29/13 2005 11/30/13 1120  NA 138 142  K 3.2* 3.7  CL 100 105  CO2 29 22  BUN 21 19  CREATININE 0.70 0.49*  GLUCOSE 138* 111*  CALCIUM 8.9 8.7   Left hip: Pain with motion. Leg shortened and rotated. N-V intact.  Assessment/Plan: Femoral neck fx left hip Plan: For hemiarthroplasty left hip today at !PM. Recheck cbc/INR this am.   Jenna Barber 12/01/2013, 9:07 AM

## 2013-12-01 NOTE — Progress Notes (Signed)
Orthopedic Tech Progress Note Patient Details:  MAYO OWCZARZAK Dec 23, 1929 [id number] Patient refused ohf and has knee immobilizer Patient ID: Alto [place], female   DOB: 09/25/1929, 79 y.o.   MRN: [id number]   Jennye Moccasin 12/01/2013, 8:37 PM

## 2013-12-01 NOTE — Preoperative (Signed)
Beta Blockers   Reason not to administer Beta Blockers:Not Applicable 

## 2013-12-01 NOTE — Anesthesia Procedure Notes (Signed)
Procedure Name: Intubation Date/Time: 12/01/2013 1:35 PM Performed by: Sharlene Dory E Pre-anesthesia Checklist: Patient identified, Emergency Drugs available, Suction available, Patient being monitored and Timeout performed Patient Re-evaluated:Patient Re-evaluated prior to inductionOxygen Delivery Method: Circle system utilized Preoxygenation: Pre-oxygenation with 100% oxygen Intubation Type: IV induction Ventilation: Mask ventilation without difficulty Laryngoscope Size: Mac and 3 Grade View: Grade I Tube type: Oral Tube size: 7.0 mm Number of attempts: 1 Airway Equipment and Method: Stylet Placement Confirmation: ETT inserted through vocal cords under direct vision,  positive ETCO2 and breath sounds checked- equal and bilateral Secured at: 22 cm Tube secured with: Tape Dental Injury: Teeth and Oropharynx as per pre-operative assessment

## 2013-12-01 NOTE — Anesthesia Preprocedure Evaluation (Addendum)
Anesthesia Evaluation  Patient identified by MRN, date of birth, ID band Patient awake    Reviewed: Allergy & Precautions, H&P , NPO status , Patient's Chart, lab work & pertinent test results  Airway Mallampati: II  Neck ROM: full    Dental  (+) Upper Dentures and Lower Dentures   Pulmonary former smoker,          Cardiovascular hypertension,     Neuro/Psych Depression Alzheimer's dementia Neuromuscular disease    GI/Hepatic   Endo/Other    Renal/GU      Musculoskeletal   Abdominal   Peds  Hematology   Anesthesia Other Findings   Reproductive/Obstetrics                          Anesthesia Physical Anesthesia Plan  ASA: III  Anesthesia Plan: General   Post-op Pain Management:    Induction: Intravenous  Airway Management Planned: Oral ETT  Additional Equipment:   Intra-op Plan:   Post-operative Plan: Extubation in OR  Informed Consent: I have reviewed the patients History and Physical, chart, labs and discussed the procedure including the risks, benefits and alternatives for the proposed anesthesia with the patient or authorized representative who has indicated his/her understanding and acceptance.     Plan Discussed with: CRNA, Anesthesiologist and Surgeon  Anesthesia Plan Comments:         Anesthesia Quick Evaluation

## 2013-12-01 NOTE — Transfer of Care (Signed)
Immediate Anesthesia Transfer of Care Note  Patient: Jenna Barber  Procedure(s) Performed: Procedure(s): Hemi-Arthroplasty Left Hip (Left)  Patient Location: PACU  Anesthesia Type:General  Level of Consciousness: awake, patient cooperative and lethargic  Airway & Oxygen Therapy: Patient Spontanous Breathing and Patient connected to nasal cannula oxygen  Post-op Assessment: Report given to PACU RN and Post -op Vital signs reviewed and stable  Post vital signs: Reviewed and stable  Complications: No apparent anesthesia complications

## 2013-12-02 ENCOUNTER — Encounter (HOSPITAL_COMMUNITY): Payer: Self-pay | Admitting: Orthopedic Surgery

## 2013-12-02 DIAGNOSIS — K5900 Constipation, unspecified: Secondary | ICD-10-CM

## 2013-12-02 DIAGNOSIS — Z7901 Long term (current) use of anticoagulants: Secondary | ICD-10-CM

## 2013-12-02 DIAGNOSIS — N390 Urinary tract infection, site not specified: Secondary | ICD-10-CM

## 2013-12-02 LAB — URINE CULTURE

## 2013-12-02 LAB — BASIC METABOLIC PANEL
BUN: 13 mg/dL (ref 6–23)
BUN: 13 mg/dL (ref 6–23)
CO2: 23 mEq/L (ref 19–32)
CO2: 25 mEq/L (ref 19–32)
Calcium: 8.4 mg/dL (ref 8.4–10.5)
Chloride: 110 mEq/L (ref 96–112)
Chloride: 110 mEq/L (ref 96–112)
Creatinine, Ser: 0.42 mg/dL — ABNORMAL LOW (ref 0.50–1.10)
GFR calc non Af Amer: >90 mL/min (ref >90)
GFR calc non Af Amer: 89 mL/min — ABNORMAL LOW (ref >90)
Glucose, Bld: 125 mg/dL — ABNORMAL HIGH (ref 70–99)
Glucose, Bld: 98 mg/dL (ref 70–99)
Potassium: 3.7 mEq/L (ref 3.5–5.1)
Sodium: 144 mEq/L (ref 135–145)

## 2013-12-02 LAB — CBC
HCT: 30.8 % — ABNORMAL LOW (ref 36.0–46.0)
Hemoglobin: 10.2 g/dL — ABNORMAL LOW (ref 12.0–15.0)
MCH: 32.9 pg (ref 26.0–34.0)
MCHC: 33.1 g/dL (ref 30.0–36.0)
RBC: 3.1 MIL/uL — ABNORMAL LOW (ref 3.87–5.11)

## 2013-12-02 LAB — PROTIME-INR: Prothrombin Time: 15.1 seconds (ref 11.6–15.2)

## 2013-12-02 MED ORDER — WARFARIN SODIUM 3 MG PO TABS
3.0000 mg | ORAL_TABLET | Freq: Once | ORAL | Status: AC
  Administered 2013-12-02: 3 mg via ORAL
  Filled 2013-12-02: qty 1

## 2013-12-02 MED ORDER — BISACODYL 10 MG RE SUPP: 10.0000 mg | Freq: Every day | RECTAL | Status: DC | PRN

## 2013-12-02 MED ORDER — WARFARIN - PHARMACIST DOSING INPATIENT: Freq: Every day | Status: DC

## 2013-12-02 NOTE — Progress Notes (Signed)
Subjective: 1 Day Post-Op Procedure(s) (LRB): Hemi-Arthroplasty Left Hip (Left) Patient reports pain as 1 on 0-10 scale.  No c/o's.   Objective: Vital signs in last 24 hours: Temp:  [98 F (36.7 C)-99.2 F (37.3 C)] 98.6 F (37 C) (12/03 1220) Pulse Rate:  [90-103] 91 (12/03 1220) Resp:  [12-24] 16 (12/03 1220) BP: (112-162)/(40-96) 125/46 mmHg (12/03 1220) SpO2:  [99 %-100 %] 100 % (12/03 1220)  Intake/Output from previous day: 12/02 0701 - 12/03 0700 In: 1120 [P.O.:120; I.V.:1000] Out: 700 [Urine:350; Blood:350] Intake/Output this shift:     Recent Labs  11/29/13 2005 12/01/13 1010 12/02/13 0459  HGB 13.4 12.8 10.2*    Recent Labs  12/01/13 1010 12/02/13 0459  WBC 5.5 6.1  RBC 3.87 3.10*  HCT 38.6 30.8*  PLT 160 159    Recent Labs  12/02/13 0055 12/02/13 0459  NA 142 144  K 3.7 3.5  CL 110 110  CO2 23 25  BUN 13 13  CREATININE 0.42* 0.48*  GLUCOSE 125* 98  CALCIUM 7.9* 8.4    Recent Labs  12/01/13 1010 12/02/13 0459  INR 1.16 1.22   Left hip exam: Neurovascular intact Sensation intact distally Intact pulses distally Incision: dressing C/D/I Compartment soft Has chronic bilateral achilles contractures. Assessment/Plan: 1 Day Post-Op Procedure(s) (LRB): Hemi-Arthroplasty Left Hip (Left) Plan: Up to chair with PT      She is a non ambulator. WBAT on left with posterior hip precautions. Tylenol prn pain Resume pre op coumadin  To SNF when medically stable.  Tereka Thorley G 12/02/2013, 1:23 PM

## 2013-12-02 NOTE — Evaluation (Signed)
Occupational Therapy Evaluation Patient Details Name: [name] MRN: [id number] DOB: 05/15/1929 Today's Date: 12/02/2013 Time: 4098-1191 OT Time Calculation (min): 21 min  OT Assessment / Plan / Recommendation History of present illness Jenna Barber is an 79 y.o. female with hx of dementia (moderate), Parkinson's disease, HTN, arthritis, hyperlipidemia, gout, nursing home resident with DNR code status, fell about five days ago, brought to the ER as she has continued pain on her left hip. Outside Xray showed displaced trochanteric Fx of the humeral head. S/P L hip hemiarthroplasty on 12/01/13.   Clinical Impression   Pt was assessed for acute OT, it appears that her PLOF was bed bound and pt was non-ambulatory. Pt requires total care for all ADL's and anticipate that she did PTA as well. Attempted to call [facility] (unit) where pt resides, but call went unanswered. Do not recommend further acute OT, will sign off. Per chart review, plan is for pt to return to [facility] w/ 24hr PRN assist.    OT Assessment  Patient does not need any further OT services    Follow Up Recommendations  SNF;Supervision/Assistance - 24 hour    Barriers to Discharge      Equipment Recommendations  Other (comment);None recommended by OT (Defer to next venue)    Recommendations for Other Services    Frequency       Precautions / Restrictions Precautions Precautions: Posterior Hip;Fall Precaution Booklet Issued: No Precaution Comments: Pt was bed bound/w/c bound prior to this fall Required Braces or Orthoses: Knee Immobilizer - Left Knee Immobilizer - Left: On at all times Restrictions Weight Bearing Restrictions: Yes LLE Weight Bearing: Weight bearing as tolerated   Pertinent Vitals/Pain No c/o pain/denies pain, however pt would occasionally state "That hurts" re: L hip in sitting. RN aware, repositioned.    ADL  Eating/Feeding: Performed;+1 Total assistance Where Assessed - Eating/Feeding: Bed  level Grooming: Performed;Wash/dry face (Pt total assist +1-2 to sit EOB & wash face) Where Assessed - Grooming: Unsupported sitting Upper Body Bathing: +1 Total assistance;Simulated;+2 Total assistance Where Assessed - Upper Body Bathing: Supine, head of bed up Lower Body Bathing: Simulated;+2 Total assistance Lower Body Bathing: Patient Percentage: 0% Where Assessed - Lower Body Bathing: Supine, head of bed up Upper Body Dressing: Performed;+2 Total assistance Upper Body Dressing: Patient Percentage: 10% Where Assessed - Upper Body Dressing: Unsupported sitting;Supported sitting Lower Body Dressing: Performed;+2 Total assistance Lower Body Dressing: Patient Percentage: 0% Where Assessed - Lower Body Dressing: Supine, head of bed flat;Rolling right and/or left Toilet Transfer: Simulated;+2 Total assistance (Supine to sitting EOB, pt 0% w/ +2 total assist) Toilet Transfer: Patient Percentage: 0% Toilet Transfer Method: Other (comment) (Bed level, bed pan) Toilet Transfer Equipment: Other (comment) (Bed pan) Toileting - Clothing Manipulation and Hygiene: Simulated;+2 Total assistance Toileting - Clothing Manipulation and Hygiene: Patient Percentage: 0% Tub/Shower Transfer Method: Not assessed Equipment Used: Knee Immobilizer Transfers/Ambulation Related to ADLs: Pt is +2 total assist for all mobilty at bedlevel, pt 0% ADL Comments: Anticipate that Pt's PLOF is bedbound/W/C bound w/ +2 total assist. Attempted to call [facility] for PLOF, but call went unanswered on unit. Pt is not currently candidate for acute skilled OT, will sign off at this time as plan is for pt to return to SNF.    OT Diagnosis:    OT Problem List:   OT Treatment Interventions:     OT Goals(Current goals can be found in the care plan section) Acute Rehab OT Goals Patient Stated  Goal: none stated  Visit Information  Last OT Received On: 12/02/13 Assistance Needed: +2 PT/OT/SLP Co-Evaluation/Treatment: Yes PT  goals addressed during session: Mobility/safety with mobility OT goals addressed during session: ADL's and self-care History of Present Illness: [name] is an 79 y.o. female with hx of dementia (moderate), Parkinson's disease, HTN, arthritis, hyperlipidemia, gout, nursing home resident with DNR code status, fell about five days ago, brought to the ER as she has continued pain on her left hip. Outside Xray showed displaced trochanteric Fx of the humeral head. S/P L hip hemiarthroplasty on 12/01/13.       Prior Functioning     Home Living Family/patient expects to be discharged to:: Skilled nursing facility Prior Function Level of Independence: Needs assistance Gait / Transfers Assistance Needed: Anticipate pt was totals care PLOF, attempted to confirm w/ [facility], however, no answer at facility/unit where pt resides. ADL's / Homemaking Assistance Needed: total assistance for ADLs Communication Communication: HOH    Vision/Perception Vision - Assessment Vision Assessment: Vision not tested   Cognition  Cognition Arousal/Alertness: Awake/alert Behavior During Therapy: WFL for tasks assessed/performed Overall Cognitive Status: History of cognitive impairments - at baseline Memory: Decreased recall of precautions;Decreased short-term memory    Extremity/Trunk Assessment Upper Extremity Assessment Upper Extremity Assessment: Generalized weakness;RUE deficits/detail;LUE deficits/detail (Simultaneous filing. User may not have seen previous data.) RUE Deficits / Details: Pt does not actively move UE's. PROM for shoulder flexion limited to approximately 80-90* RUE Coordination: decreased fine motor;decreased gross motor LUE Deficits / Details: Pt does not actively move UE's. PROM for shoulder flexion impaired/limited to approximately 85-95* LUE Coordination: decreased fine motor;decreased gross motor Lower Extremity Assessment Lower Extremity Assessment: Defer to PT evaluation  (Simultaneous filing. User may not have seen previous data.) RLE Deficits / Details: limited active movement; foot drop contracture LLE Deficits / Details: limited active movement, did not fully assess due to knee immobilizer; foot drop contracture Cervical / Trunk Assessment Cervical / Trunk Assessment: Kyphotic    Mobility Bed Mobility Bed Mobility: Supine to Sit;Sit to Supine;Sitting - Scoot to Edge of Bed Supine to Sit: 1: +2 Total assist Supine to Sit: Patient Percentage: 0% Sitting - Scoot to Edge of Bed: 1: +2 Total assist Sitting - Scoot to Edge of Bed: Patient Percentage: 0% Sit to Supine: 1: +2 Total assist Sit to Supine: Patient Percentage: 0% Details for Bed Mobility Assistance: attempted to have pt. assist with mobility however unable to participate Transfers Transfers: Not assessed        Balance Balance Balance Assessed: Yes Static Sitting Balance Static Sitting - Balance Support: No upper extremity supported;Feet supported (Total assist +1 for sitting balance) Static Sitting - Level of Assistance: 1: +2 Total assist;Patient percentage (comment) (0%) Static Sitting - Comment/# of Minutes: 10   End of Session OT - End of Session Equipment Utilized During Treatment: Left knee immobilizer Activity Tolerance: Patient tolerated treatment well Patient left: in bed;with call bell/phone within reach;with bed alarm set;with nursing/sitter in room Nurse Communication: Mobility status;Need for lift equipment;Other (comment) (Bed level ADL's recommended & Lift vs bedpan for toileting)  GO     Alm Bustard 12/02/2013, 8:39 AM

## 2013-12-02 NOTE — Evaluation (Signed)
Physical Therapy Evaluation Patient Details Name: Jenna Barber MRN: [id number] DOB: April 28, 1929 Today's Date: 12/02/2013 Time: 4098-1191 PT Time Calculation (min): 20 min  PT Assessment / Plan / Recommendation History of Present Illness  Jenna Barber is an 79 y.o. female with hx of dementia (moderate), Parkinson's disease, HTN, arthritis, hyperlipidemia, gout, nursing home resident with DNR code status, fell about five days ago, brought to the ER as she has continued pain on her left hip. Outside Xray showed displaced trochanteric Fx of the humeral head. S/P L hip hemiarthroplasty on 12/01/13.  Clinical Impression  Pt is s/p fall with L hip fx s/p L hip hemiarthroplasty resulting in the deficits listed below (see PT Problem List). Pt will benefit from skilled PT to increase their independence and safety with mobility to allow discharge to the venue listed below. Recommend return to SNF.  At this time pt. With limited participation which may be near baseline.  Unable to reach caregiver at [facility] to confirm.      PT Assessment  Patient needs continued PT services    Follow Up Recommendations  SNF    Does the patient have the potential to tolerate intense rehabilitation      Barriers to Discharge        Equipment Recommendations  None recommended by PT    Recommendations for Other Services     Frequency Min 3X/week    Precautions / Restrictions Precautions Precautions: Posterior Hip;Fall Precaution Booklet Issued: No Precaution Comments: Pt was bed bound/w/c bound prior to this fall Required Braces or Orthoses: Knee Immobilizer - Left Knee Immobilizer - Left: On at all times Restrictions Weight Bearing Restrictions: Yes LLE Weight Bearing: Weight bearing as tolerated   Pertinent Vitals/Pain C/o L hip pain; unable to rate due to cognition.  RN aware and pt. Repositioned.      Mobility  Bed Mobility Bed Mobility: Supine to Sit;Sit to Supine;Sitting - Scoot to Edge of  Bed Supine to Sit: 1: +2 Total assist Supine to Sit: Patient Percentage: 0% Sitting - Scoot to Edge of Bed: 1: +2 Total assist Sitting - Scoot to Edge of Bed: Patient Percentage: 0% Sit to Supine: 1: +2 Total assist Sit to Supine: Patient Percentage: 0% Details for Bed Mobility Assistance: attempted to have pt. assist with mobility however unable to participate Transfers Transfers: Not assessed Ambulation/Gait Ambulation/Gait Assistance: Not tested (comment) Wheelchair Mobility Wheelchair Mobility: No    Exercises     PT Diagnosis: Difficulty walking;Generalized weakness;Acute pain  PT Problem List: Decreased strength;Decreased range of motion;Decreased activity tolerance;Decreased balance;Decreased mobility;Decreased coordination;Decreased cognition;Decreased knowledge of use of DME;Decreased safety awareness;Decreased knowledge of precautions;Pain PT Treatment Interventions: DME instruction;Functional mobility training;Therapeutic activities;Therapeutic exercise;Patient/family education     PT Goals(Current goals can be found in the care plan section) Acute Rehab PT Goals Patient Stated Goal: none stated PT Goal Formulation: Patient unable to participate in goal setting Time For Goal Achievement: 12/09/13 Potential to Achieve Goals: Fair  Visit Information  Last PT Received On: 12/02/13 Assistance Needed: +2 PT/OT/SLP Co-Evaluation/Treatment: Yes PT goals addressed during session: Mobility/safety with mobility OT goals addressed during session: ADL's and self-care History of Present Illness: Jenna Barber is an 79 y.o. female with hx of dementia (moderate), Parkinson's disease, HTN, arthritis, hyperlipidemia, gout, nursing home resident with DNR code status, fell about five days ago, brought to the ER as she has continued pain on her left hip. Outside Xray showed displaced trochanteric Fx of the humeral head. S/P L hip  hemiarthroplasty on 12/01/13.       Prior Functioning  Home  Living Family/patient expects to be discharged to:: Skilled nursing facility Prior Function Level of Independence: Needs assistance Gait / Transfers Assistance Needed: Anticipate pt was totals care PLOF, attempted to confirm w/ [facility], however, no answer at facility/unit where pt resides. ADL's / Homemaking Assistance Needed: total assistance for ADLs Communication Communication: HOH    Cognition  Cognition Arousal/Alertness: Awake/alert Behavior During Therapy: WFL for tasks assessed/performed Overall Cognitive Status: History of cognitive impairments - at baseline Memory: Decreased recall of precautions;Decreased short-term memory    Extremity/Trunk Assessment Upper Extremity Assessment Upper Extremity Assessment: Generalized weakness;RUE deficits/detail;LUE deficits/detail (Simultaneous filing. User may not have seen previous data.) RUE Deficits / Details: Pt does not actively move UE's. PROM for shoulder flexion limited to approximately 80-90* RUE Coordination: decreased fine motor;decreased gross motor LUE Deficits / Details: Pt does not actively move UE's. PROM for shoulder flexion impaired/limited to approximately 85-95* LUE Coordination: decreased fine motor;decreased gross motor Lower Extremity Assessment Lower Extremity Assessment: Defer to PT evaluation (Simultaneous filing. User may not have seen previous data.) RLE Deficits / Details: limited active movement; foot drop contracture LLE Deficits / Details: limited active movement, did not fully assess due to knee immobilizer; foot drop contracture Cervical / Trunk Assessment Cervical / Trunk Assessment: Kyphotic   Balance Balance Balance Assessed: Yes Static Sitting Balance Static Sitting - Balance Support: No upper extremity supported;Feet supported Static Sitting - Level of Assistance: 1: +2 Total assist;Patient percentage (comment) (0%) Static Sitting - Comment/# of Minutes: 10  End of Session PT - End of  Session Equipment Utilized During Treatment: Oxygen;Left knee immobilizer Activity Tolerance: Patient tolerated treatment well (pt. limited by prior functional status) Patient left: in bed;with bed alarm set;with nursing/sitter in room;with call bell/phone within reach Nurse Communication: Mobility status;Need for lift equipment;Precautions;Weight bearing status  GP     Ernestene Mention 12/02/2013, 8:36 AM  Clarita Crane, PT, DPT [phone]

## 2013-12-02 NOTE — Progress Notes (Addendum)
[facility]                                                                                Patient Demographics  Jenna Barber, is a 79 y.o. female, DOB - [birth year], ZOX:[id number]  Admit date - 11/29/2013   Admitting Physician Houston Siren, MD  Outpatient Primary MD for the patient is No primary provider on file.  LOS - 3   Chief Complaint  Patient presents with  . Hip Injury        Assessment & Plan  Active Problems:   Long term (current) use of anticoagulants   Alzheimer's disease   Osteoarthrosis, unspecified whether generalized or localized, unspecified site   Femoral neck fracture   History of DVT (deep vein thrombosis)   DNR (do not resuscitate)   Hip fracture   UTI (urinary tract infection)  Left femoral neck fracture -POD 1 from left hemiarthroplasty -Follwed by orthopedics, continue pain control -PT/OT consulted and following  UTI -Urine cultures positive for EColi -Continue Ceftriaxone  DVT -Coumadin held due to surgery -Will restart with pharmacy to dose and follow  Peripheral Neuropathy -Continue gabapentin  Constipation -Continue miralax, Senokot  Depression/Insomnia -Continue Remeron  Alzheimer's Dementia -Continue Namenda and aricept  HTN -Currently controlled, on no medications, will continue to monitor  Code Status: DNR  Family Communication: None at this time.  Disposition Plan: Will continue PT/OT.  Patient will need SNF placement.  CM/SW consulted.  Procedures  Left hip hemiarthroplasty  Consults   Orthopedics  DVT Prophylaxis  SCDs   Lab Results  Component Value Date   PLT 159 12/02/2013    Medications  Scheduled Meds: . calcium-vitamin D  1 tablet Oral Q breakfast  . cefTRIAXone (ROCEPHIN)  IV  1 g Intravenous QHS  . docusate sodium  100 mg Oral BID  . donepezil  10 mg Oral QHS  . feeding supplement (ENSURE COMPLETE)  237 mL Oral BID BM  . gabapentin  300 mg Oral TID  . memantine  10 mg Oral BID  . mirtazapine   15 mg Oral QHS  . oxyCODONE-acetaminophen  2 tablet Oral BID  . polyethylene glycol  17 g Oral Daily  . senna-docusate  2 tablet Oral QHS   Continuous Infusions: . dextrose 5 % and 0.9% NaCl 75 mL/hr at 12/01/13 0600  . lactated ringers 50 mL/hr at 12/01/13 1308   PRN Meds:.acetaminophen, acetaminophen, alum & mag hydroxide-simeth, bisacodyl, camphor-menthol, HYDROmorphone (DILAUDID) injection, ondansetron (ZOFRAN) IV, ondansetron, polyvinyl alcohol  Antibiotics   Anti-infectives   Start     Dose/Rate Route Frequency Ordered Stop   12/01/13 1415  ceFAZolin (ANCEF) IVPB 2 g/50 mL premix     2 g 100 mL/hr over 30 Minutes Intravenous  Once 12/01/13 1406 12/01/13 1352   12/01/13 1400  cefTRIAXone (ROCEPHIN) 1 g in dextrose 5 % 50 mL IVPB  Status:  Discontinued     1 g 100 mL/hr over 30 Minutes Intravenous To Surgery 12/01/13 1345 12/01/13 1359   11/30/13 0100  cefTRIAXone (ROCEPHIN) 1 g in dextrose 5 % 50 mL IVPB     1 g 100 mL/hr over 30 Minutes Intravenous Daily at bedtime 11/29/13 2358  Time Spent in minutes   30 minutes   Kama Cammarano D.O. on 12/02/2013 at 10:21 AM  Between 7am to 7pm - Pager - [phone]  After 7pm go to [email or web address] - password [email or web address]  And look for the night coverage person covering for me after hours  [facility] Office  [phone]    Subjective:   Jenna Barber seen and examined today.  Patient complains of left hip pain. She states it is achy in nature.  Patient denies dizziness, chest pain, shortness of breath, abdominal pain, N/V/D/C, new weakness, numbess, tingling.    Objective:   Filed Vitals:   12/01/13 1630 12/01/13 1650 12/01/13 2117 12/02/13 0602  BP:  126/64 124/57 112/47  Pulse: 99 90 97 90  Temp: 98 F (36.7 C) 99 F (37.2 C) 98.6 F (37 C) 98.5 F (36.9 C)  TempSrc:  Axillary Oral Oral  Resp: 12 16 15 15   Height:      Weight:      SpO2: 100% 100% 100% 99%    Wt Readings from Last 3 Encounters:   11/29/13 81.647 kg (180 lb)  11/29/13 81.647 kg (180 lb)  11/29/13 81.647 kg (180 lb)     Intake/Output Summary (Last 24 hours) at 12/02/13 1021 Last data filed at 12/02/13 0658  Gross per 24 hour  Intake   1120 ml  Output    700 ml  Net    420 ml    Exam  General: Well developed, well nourished, NAD, appears stated age  HEENT: NCAT, PERRLA, EOMI, Anicteic Sclera, mucous membranes moist. No pharyngeal erythema or exudates  Neck: Supple, no JVD, no masses  Cardiovascular: S1 S2 auscultated, no rubs, murmurs or gallops. Regular rate and rhythm.  Respiratory: Clear to auscultation bilaterally with equal chest rise  Abdomen: Soft, nontender, nondistended, + bowel sounds  Extremities: warm dry without cyanosis clubbing or edema. Pain to palpation of the left hip and with motion.    Neuro: AAOx3, cranial nerves grossly intact.   Skin: Without rashes exudates or nodules  Psych: Normal affect and demeanor with intact judgement and insight  Data Review   Micro Results Recent Results (from the past 240 hour(s))  URINE CULTURE     Status: None   Collection Time    11/29/13  9:36 PM      Result Value Range Status   Specimen Description URINE, CATHETERIZED   Final   Special Requests NONE   Final   Culture  Setup Time     Final   Value: 11/30/2013 09:01     Performed at [facility] Count     Final   Value: >=100,000 COLONIES/ML     Performed at [facility]   Culture     Final   Value: ESCHERICHIA COLI     Performed at [facility]   Report Status 12/02/2013 FINAL   Final   Organism ID, Bacteria ESCHERICHIA COLI   Final    Radiology Reports Dg Chest 1 View  11/29/2013   *RADIOLOGY REPORT*  Clinical Data: Hip injury, recent fall  CHEST - 1 VIEW  Comparison: 04/25/2008  Findings: Large hiatal hernia.  Aortic atherosclerosis. Interstitial coarsening.  Mild left greater than right lung base opacity. Left lung granuloma.  No definite  pleural effusion or pneumothorax. Incidental azygos fissure.  Osteopenia.  Cervical fusion hardware is partially imaged.  IMPRESSION: Large hiatal hernia.  Mild left greater than right lung base opacities; atelectasis/scarring (favored) versus  infiltrate.  Interstitial prominence is at least in part chronic.  Superimposed atypical/viral infection or interstitial edema not excluded.   Original Report Authenticated By: Jearld Lesch, M.D.   Dg Hip Complete Left  11/29/2013   *RADIOLOGY REPORT*  Clinical Data: Left the hip pain status post recent fall  LEFT HIP - COMPLETE 2+ VIEW  Comparison: None.  Findings: Displaced left femoral neck fracture with the proximal/superior migration of the distal component.  The femoral head remains seated within the acetabulum and abducted in position. Diffuse osteopenia.  Sacrum partially obscured by overlying bowel. Degenerative changes of the lower lumbar spine.  IMPRESSION: Displaced left femoral neck fracture.   Original Report Authenticated By: Jearld Lesch, M.D.   Ct Head Wo Contrast  11/29/2013   CLINICAL DATA:  Fall.  Alzheimer's dementia.  EXAM: CT HEAD WITHOUT CONTRAST  TECHNIQUE: Contiguous axial images were obtained from the base of the skull through the vertex without intravenous contrast.  COMPARISON:  05/16/2008  FINDINGS: Sinuses/Soft tissues: Motion degradation, primarily on the 1st images. No definite soft tissue swelling identified.  Clear paranasal sinuses and mastoid air cells.  No skull fracture.  Intracranial: Cerebral atrophy and mild low density in the periventricular white matter likely related to small vessel disease. Ventriculomegaly which is favored to be related to cerebral atrophy. This is unchanged.  No acute infarct, mass lesion, intra-axial, or extra-axial fluid collection.  IMPRESSION: 1. Motion degraded exam. 2.  No acute intracranial abnormality. 3.  Cerebral atrophy and small vessel ischemic change.   Electronically Signed   By:  Jeronimo Greaves M.D.   On: 11/29/2013 21:10   Dg Pelvis Portable  12/01/2013   CLINICAL DATA:  Postoperative left hip replacement  EXAM: PORTABLE PELVIS 1-2 VIEWS  COMPARISON:  December 25, 2007  IMPRESSION: Left femoral hemiarthroplasty is identified without malalignment. Postoperative changes identified within the soft tissues of left hip.  FINDINGS: : FINDINGS:  Left femoral hemiarthroplasty is identified without malalignment. Air is identified within the soft tissues of the left hip consistent with recent postoperative change. There is no acute fracture.   Electronically Signed   By: Sherian Rein M.D.   On: 12/01/2013 16:21   Dg Hip Portable 1 View Left  12/01/2013   CLINICAL DATA:  Status post left hip replacement  EXAM: PORTABLE LEFT HIP - 1 VIEW  COMPARISON:  November 29, 2013  FINDINGS: Portable cross-table lateral view of the left hip demonstrate left femoral hemiarthroplasty without malalignment. Postsurgical changes including skin staples and soft tissue air are noted.  IMPRESSION: Left femoral hemiarthroplasty without malalignment.   Electronically Signed   By: Sherian Rein M.D.   On: 12/01/2013 16:23   Dg Knee Complete 4 Views Left  11/29/2013   *RADIOLOGY REPORT*  Clinical Data: Left knee pain  LEFT KNEE - COMPLETE 4+ VIEW  Comparison: None.  Findings: Limited positioning due to a left femoral neck fracture described on the contemporaneous left hip report. Status post left total knee arthroplasty.  No periprosthetic lucency.  No displaced acute fracture or dislocation appreciated.  No joint effusion. Osteopenia.  IMPRESSION: Limited positioning.  No definite fracture or dislocation of the left knee.  Status post left total knee arthroplasty.   Original Report Authenticated By: Jearld Lesch, M.D.    CBC  Recent Labs Lab 11/29/13 2005 12/01/13 1010 12/02/13 0459  WBC 6.7 5.5 6.1  HGB 13.4 12.8 10.2*  HCT 39.1 38.6 30.8*  PLT 173 160 159  MCV 97.3 99.7 99.4  MCH 33.3 33.1 32.9   MCHC 34.3 33.2 33.1  RDW 15.4 15.4 15.6*  LYMPHSABS 1.8  --   --   MONOABS 0.6  --   --   EOSABS 0.3  --   --   BASOSABS 0.0  --   --     Chemistries   Recent Labs Lab 11/29/13 2005 11/30/13 1120 12/02/13 0055 12/02/13 0459  NA 138 142 142 144  K 3.2* 3.7 3.7 3.5  CL 100 105 110 110  CO2 29 22 23 25   GLUCOSE 138* 111* 125* 98  BUN 21 19 13 13   CREATININE 0.70 0.49* 0.42* 0.48*  CALCIUM 8.9 8.7 7.9* 8.4   ------------------------------------------------------------------------------------------------------------------ estimated creatinine clearance is 59.6 ml/min (by C-G formula based on Cr of 0.48). ------------------------------------------------------------------------------------------------------------------ No results found for this basename: HGBA1C,  in the last 72 hours ------------------------------------------------------------------------------------------------------------------ No results found for this basename: CHOL, HDL, LDLCALC, TRIG, CHOLHDL, LDLDIRECT,  in the last 72 hours ------------------------------------------------------------------------------------------------------------------ No results found for this basename: TSH, T4TOTAL, FREET3, T3FREE, THYROIDAB,  in the last 72 hours ------------------------------------------------------------------------------------------------------------------ No results found for this basename: VITAMINB12, FOLATE, FERRITIN, TIBC, IRON, RETICCTPCT,  in the last 72 hours  Coagulation profile  Recent Labs Lab 11/29/13 2005 11/30/13 1120 12/01/13 1010 12/02/13 0459  INR 3.59* 2.21* 1.16 1.22    No results found for this basename: DDIMER,  in the last 72 hours  Cardiac Enzymes No results found for this basename: CK, CKMB, TROPONINI, MYOGLOBIN,  in the last 168 hours ------------------------------------------------------------------------------------------------------------------ No components found with this  basename: POCBNP,

## 2013-12-02 NOTE — Progress Notes (Signed)
ANTICOAGULATION CONSULT NOTE - Follow Up Consult  Pharmacy Consult for Warfarin Indication: Hx DVT  Allergies  Allergen Reactions  . Bextra [Valdecoxib]   . Clindamycin/Lincomycin   . Codeine   . Diclofenac   . Methadone   . Mobic [Meloxicam]   . Naproxen   . Penicillins   . Sulfa Antibiotics     Patient Measurements: Height: 5\' 7"  (170.2 cm) Weight: 180 lb (81.647 kg) IBW/kg (Calculated) : 61.6  Vital Signs: Temp: 98.6 F (37 C) (12/03 1220) Temp src: Oral (12/03 0730) BP: 125/46 mmHg (12/03 1220) Pulse Rate: 91 (12/03 1220)  Labs:  Recent Labs  11/29/13 2005 11/30/13 1120 12/01/13 1010 12/02/13 0055 12/02/13 0459  HGB 13.4  --  12.8  --  10.2*  HCT 39.1  --  38.6  --  30.8*  PLT 173  --  160  --  159  LABPROT 34.5* 23.8* 14.6  --  15.1  INR 3.59* 2.21* 1.16  --  1.22  CREATININE 0.70 0.49*  --  0.42* 0.48*    Estimated Creatinine Clearance: 59.6 ml/min (by C-G formula based on Cr of 0.48).   Assessment: 79 y.o. M on warfarin PTA for hx DVT -- however held on admit and reversed for L-hip fracture repair, done on 12/2. Warfarin dose not entered yesterday evening, so will be resumed today. INR remains SUBtherapeutic (INR 1.22 << 1.16, goal of 2-3). Hgb/Hct drop, plts wnl.   Attempted warfarin re-education with the patient today however she could not comprehend and/or follow along with the conversation. Will attempt to wait for family to arrive.  Goal of Therapy:  INR 2-3   Plan:  1. Warfarin 3 mg x 1 dose at 1800 today 2. Consider adding Lovenox 40 mg SQ daily for VTE prophylaxis while awaiting INR >/= 2.  3. Will continue to monitor for any signs/symptoms of bleeding and will follow up with PT/INR in the a.m.   Georgina Pillion, PharmD, BCPS Clinical Pharmacist Pager: [phone] 12/02/2013 1:06 PM

## 2013-12-03 LAB — PROTIME-INR
INR: 1.24 (ref 0.00–1.49)
Prothrombin Time: 15.3 seconds — ABNORMAL HIGH (ref 11.6–15.2)

## 2013-12-03 MED ORDER — OXYCODONE-ACETAMINOPHEN 5-325 MG PO TABS: 2.0000 | ORAL_TABLET | Freq: Two times a day (BID) | ORAL | Status: DC

## 2013-12-03 MED ORDER — CEPHALEXIN 500 MG PO CAPS: 500.0000 mg | ORAL_CAPSULE | Freq: Two times a day (BID) | ORAL | Status: AC

## 2013-12-03 MED ORDER — ALUM & MAG HYDROXIDE-SIMETH 200-200-20 MG/5ML PO SUSP: 30.0000 mL | ORAL | Status: AC | PRN

## 2013-12-03 MED ORDER — ENSURE COMPLETE PO LIQD: 237.0000 mL | Freq: Two times a day (BID) | ORAL | Status: AC

## 2013-12-03 MED ORDER — DSS 100 MG PO CAPS
100.0000 mg | ORAL_CAPSULE | Freq: Two times a day (BID) | ORAL | Status: AC
Start: 2013-12-03 — End: ?

## 2013-12-03 MED ORDER — BISACODYL 10 MG RE SUPP: 10.0000 mg | Freq: Every day | RECTAL | Status: AC | PRN

## 2013-12-03 MED ORDER — WARFARIN SODIUM 3 MG PO TABS: 3.0000 mg | ORAL_TABLET | Freq: Once | ORAL | Status: AC

## 2013-12-03 MED ORDER — ACETAMINOPHEN 325 MG PO TABS: 650.0000 mg | ORAL_TABLET | Freq: Four times a day (QID) | ORAL | Status: AC | PRN

## 2013-12-03 NOTE — Discharge Summary (Signed)
Physician Discharge Summary  Jenna Barber:[id number] DOB: 1929-09-24 DOA: 11/29/2013  PCP: No primary provider on file.  Admit date: 11/29/2013 Discharge date: 12/03/2013  Time spent: 35 minutes  Recommendations for Outpatient Follow-up:  Patient will be discharged to [facility] nursing home. She is to continue taking her medications as prescribed. Patient should also have her INR checked until she becomes therapeutic. She should resume a 3 mg dose 12/03/2013 this evening and then resume her home dose of 2 mg daily starting on 12/04/2013. Again patient should have her INR checked and monitored until she is therapeutic as well as her CBC within 2 days. Patient should also follow up with orthopedics as instructed as well as her primary care physician. She should continue physical and occupational therapy as set forth by the rehabilitation center.  Discharge Diagnoses:  Active Problems:   Left Femoral neck fracture     Long term (current) use of anticoagulants   Alzheimer's disease   Osteoarthrosis, unspecified whether generalized or localized, unspecified site   History of DVT (deep vein thrombosis)   DNR (do not resuscitate)   Hip fracture   UTI (urinary tract infection)  Discharge Condition: Stable  Diet recommendation:  Dysphagia 3  Filed Weights   11/29/13 1950  Weight: 81.647 kg (180 lb)    History of present illness:  Jenna Barber is an 79 y.o. female with hx of dementia (moderate), Parkinson's disease, HTN, arthritis, hyperlipidemia, gout, nursing home resident with DNR code status, fell about five days ago, brought to the ER as she has continued pain on her left hip. Outside Xray showed displaced trochanteric Fx of the humeral head. She has been on therapeutic coumadin for history of DVT. Her head CT was negative and her CXR showed atelectasis. She has had no fever, chills, or coughs. She was seen in consultation with orthopedics, and Dr Valentino Hue planned to surgically fix her  hip once INR is corrected. She has a UA which showed evidence of a UTI. Hospitalsit was asked to admit her for hip fx, and UTI.  Hospital Course:  This is an 79 year old female with history of dementia, Parkinson's, hypertension, hyperlipidemia that resides at nursing home was brought to the emergency department for left hip pain. Patient was noted to have a left humeral head fracture. Patient does take Coumadin for history of DVT. She was given vitamin K and move her up coming surgery. Orthopedics was consulted and performed a left hip hemiarthroplasty. Physical therapy as well as occupational therapy have been working with the patient. Patient is to return back to [facility] and she is to continue physical therapy care. Patient was also noted to have a urinary tract infection upon admission. She was started on Rocephin. Her urine cultures were positive for Escherichia coli. Patient will be discharged with Keflex 500 mg twice a day for additional 4 days. As for her history of DVT. Her Coumadin was held prior to surgery. This was restarted and monitored by pharmacy. Patient will be given another additional dose of 3 mg tonight 12/03/2013 and she should continue her 2 mg dose once returning to the nursing home. Patient other medical chronic problems did remain stable including her peripheral neuropathy for which she continue gabapentin, insomnia and depression for which she continue Remeron, Alzheimer's dementia which continued Aricept and Namenda. Patient's blood pressure did remain controlled and she was on any medications while in the hospital. Patient was seen examined at discharge. She is stable for discharge. This was  all discussed with the patient she does understand and agree. Patient should followup with orthopedics at the specified time as well as her primary care physician. She should continue being off her medications as prescribed.  Procedures: Left hip  hemiarthroplasty  Consultations: Orthopedics  Discharge Exam: Filed Vitals:   12/03/13 0600  BP: 121/52  Pulse: 385  Temp: 98.4 F (36.9 C)  Resp: 19    Exam  General: Well developed, well nourished, NAD, appears stated age  HEENT: NCAT, PERRLA, EOMI, Anicteic Sclera, mucous membranes moist. No pharyngeal erythema or exudates  Neck: Supple, no JVD, no masses  Cardiovascular: S1 S2 auscultated, no rubs, murmurs or gallops. Regular rate and rhythm.  Respiratory: Clear to auscultation bilaterally with equal chest rise  Abdomen: Soft, nontender, nondistended, + bowel sounds  Extremities: warm dry without cyanosis clubbing or edema. Pain to palpation of the left hip and with motion.  Neuro: AAOx3, cranial nerves grossly intact.  Skin: Without rashes exudates or nodules  Psych: Normal affect and demeanor with intact judgement and insight  Discharge Instructions  Discharge Orders   Future Orders Complete By Expires   Diet - low sodium heart healthy  As directed    Discharge instructions  As directed    Comments:     Patient will be discharged to [facility] nursing home. She is to continue taking her medications as prescribed. Patient should also have her INR checked until she becomes therapeutic. She should resume a 3 mg dose 12/03/2013 this evening and then resume her home dose of 2 mg daily starting on 12/04/2013. Again patient should have her INR checked and monitored until she is therapeutic as well as her CBC within 2 days. Patient should also follow up with orthopedics as instructed as well as her primary care physician. She should continue physical and occupational therapy as set forth by the rehabilitation center.   Increase activity slowly  As directed    Weight bearing as tolerated  As directed    Questions:     Laterality:  left   Extremity:  Lower       Medication List         acetaminophen 325 MG tablet  Commonly known as:  TYLENOL  Take 2 tablets (650 mg  total) by mouth every 6 (six) hours as needed for mild pain.     acetaminophen 325 MG tablet  Commonly known as:  TYLENOL  Take 2 tablets (650 mg total) by mouth every 6 (six) hours as needed for mild pain (or Fever >/= 101).     alum & mag hydroxide-simeth 200-200-20 MG/5ML suspension  Commonly known as:  MAALOX/MYLANTA  Take 30 mLs by mouth every 4 (four) hours as needed for indigestion.     betamethasone dipropionate 0.05 % cream  Commonly known as:  DIPROLENE  Apply 1 application topically 2 (two) times daily.     bisacodyl 10 MG suppository  Commonly known as:  DULCOLAX  Place 1 suppository (10 mg total) rectally daily as needed for moderate constipation.     calcium-vitamin D 500-200 MG-UNIT per tablet  Commonly known as:  OSCAL WITH D  Take 1 tablet by mouth daily with breakfast.     camphor-menthol lotion  Commonly known as:  SARNA  Apply 1 application topically as needed for itching.     cephALEXin 500 MG capsule  Commonly known as:  KEFLEX  Take 1 capsule (500 mg total) by mouth 2 (two) times daily.  clotrimazole 1 % cream  Commonly known as:  LOTRIMIN  Apply 1 application topically 2 (two) times daily.     donepezil 10 MG tablet  Commonly known as:  ARICEPT  Take 10 mg by mouth at bedtime.     DSS 100 MG Caps  Take 100 mg by mouth 2 (two) times daily.     ethacrynic acid 25 MG tablet  Commonly known as:  EDECRIN  Take 25 mg by mouth daily.     feeding supplement (ENSURE COMPLETE) Liqd  Take 237 mLs by mouth 2 (two) times daily between meals.     gabapentin 300 MG capsule  Commonly known as:  NEURONTIN  Take 300 mg by mouth 3 (three) times daily.     hydrOXYzine 25 MG tablet  Commonly known as:  ATARAX/VISTARIL  Take 25 mg by mouth every 12 (twelve) hours as needed for itching.     lidocaine 5 %  Commonly known as:  LIDODERM  Place 1 patch onto the skin daily. Remove & Discard patch within 12 hours or as directed by MD     memantine 10 MG  tablet  Commonly known as:  NAMENDA  Take 10 mg by mouth 2 (two) times daily.     mirtazapine 15 MG tablet  Commonly known as:  REMERON  Take 15 mg by mouth at bedtime.     nitroGLYCERIN 0.4 MG SL tablet  Commonly known as:  NITROSTAT  Place 0.4 mg under the tongue every 5 (five) minutes as needed for chest pain.     oxyCODONE-acetaminophen 5-325 MG per tablet  Commonly known as:  PERCOCET/ROXICET  Take 2 tablets by mouth 2 (two) times daily.     polyethylene glycol packet  Commonly known as:  MIRALAX / GLYCOLAX  Take 17 g by mouth daily.     senna-docusate 8.6-50 MG per tablet  Commonly known as:  Senokot-S  Take 2 tablets by mouth at bedtime.     SYSTANE 0.4-0.3 % Soln  Generic drug:  Polyethyl Glycol-Propyl Glycol  Place 1 drop into both eyes 3 (three) times daily.     warfarin 3 MG tablet  Commonly known as:  COUMADIN  Take 1 tablet (3 mg total) by mouth one time only at 6 PM. Then resume nightly dose of 2mg  on 12/04/2013.     warfarin 2 MG tablet  Commonly known as:  COUMADIN  Take 2 mg by mouth daily.       Allergies  Allergen Reactions  . Bextra [Valdecoxib]   . Clindamycin/Lincomycin   . Codeine   . Diclofenac   . Methadone   . Mobic [Meloxicam]   . Naproxen   . Penicillins   . Sulfa Antibiotics        Follow-up Information   Follow up with GRAVES,JOHN L, MD. Schedule an appointment as soon as possible for a visit in 2 weeks.   Specialty:  Orthopedic Surgery   Contact information:   [address] [place] [address] [phone]       Follow up with Primary care physician . Schedule an appointment as soon as possible for a visit in 2 weeks.      Follow up with Physician at the nursing home In 1 day.       The results of significant diagnostics from this hospitalization (including imaging, microbiology, ancillary and laboratory) are listed below for reference.    Significant Diagnostic Studies: Dg Chest 1 View  11/29/2013   *RADIOLOGY  REPORT*  Clinical Data: Hip injury, recent fall  CHEST - 1 VIEW  Comparison: 04/25/2008  Findings: Large hiatal hernia.  Aortic atherosclerosis. Interstitial coarsening.  Mild left greater than right lung base opacity. Left lung granuloma.  No definite pleural effusion or pneumothorax. Incidental azygos fissure.  Osteopenia.  Cervical fusion hardware is partially imaged.  IMPRESSION: Large hiatal hernia.  Mild left greater than right lung base opacities; atelectasis/scarring (favored) versus infiltrate.  Interstitial prominence is at least in part chronic.  Superimposed atypical/viral infection or interstitial edema not excluded.   Original Report Authenticated By: Jearld Lesch, M.D.   Dg Hip Complete Left  11/29/2013   *RADIOLOGY REPORT*  Clinical Data: Left the hip pain status post recent fall  LEFT HIP - COMPLETE 2+ VIEW  Comparison: None.  Findings: Displaced left femoral neck fracture with the proximal/superior migration of the distal component.  The femoral head remains seated within the acetabulum and abducted in position. Diffuse osteopenia.  Sacrum partially obscured by overlying bowel. Degenerative changes of the lower lumbar spine.  IMPRESSION: Displaced left femoral neck fracture.   Original Report Authenticated By: Jearld Lesch, M.D.   Ct Head Wo Contrast  11/29/2013   CLINICAL DATA:  Fall.  Alzheimer's dementia.  EXAM: CT HEAD WITHOUT CONTRAST  TECHNIQUE: Contiguous axial images were obtained from the base of the skull through the vertex without intravenous contrast.  COMPARISON:  05/16/2008  FINDINGS: Sinuses/Soft tissues: Motion degradation, primarily on the 1st images. No definite soft tissue swelling identified.  Clear paranasal sinuses and mastoid air cells.  No skull fracture.  Intracranial: Cerebral atrophy and mild low density in the periventricular white matter likely related to small vessel disease. Ventriculomegaly which is favored to be related to cerebral atrophy. This is  unchanged.  No acute infarct, mass lesion, intra-axial, or extra-axial fluid collection.  IMPRESSION: 1. Motion degraded exam. 2.  No acute intracranial abnormality. 3.  Cerebral atrophy and small vessel ischemic change.   Electronically Signed   By: Jeronimo Greaves M.D.   On: 11/29/2013 21:10   Dg Pelvis Portable  12/01/2013   CLINICAL DATA:  Postoperative left hip replacement  EXAM: PORTABLE PELVIS 1-2 VIEWS  COMPARISON:  December 25, 2007  IMPRESSION: Left femoral hemiarthroplasty is identified without malalignment. Postoperative changes identified within the soft tissues of left hip.  FINDINGS: : FINDINGS:  Left femoral hemiarthroplasty is identified without malalignment. Air is identified within the soft tissues of the left hip consistent with recent postoperative change. There is no acute fracture.   Electronically Signed   By: Sherian Rein M.D.   On: 12/01/2013 16:21   Dg Hip Portable 1 View Left  12/01/2013   CLINICAL DATA:  Status post left hip replacement  EXAM: PORTABLE LEFT HIP - 1 VIEW  COMPARISON:  November 29, 2013  FINDINGS: Portable cross-table lateral view of the left hip demonstrate left femoral hemiarthroplasty without malalignment. Postsurgical changes including skin staples and soft tissue air are noted.  IMPRESSION: Left femoral hemiarthroplasty without malalignment.   Electronically Signed   By: Sherian Rein M.D.   On: 12/01/2013 16:23   Dg Knee Complete 4 Views Left  11/29/2013   *RADIOLOGY REPORT*  Clinical Data: Left knee pain  LEFT KNEE - COMPLETE 4+ VIEW  Comparison: None.  Findings: Limited positioning due to a left femoral neck fracture described on the contemporaneous left hip report. Status post left total knee arthroplasty.  No periprosthetic lucency.  No displaced acute fracture or dislocation appreciated.  No joint  effusion. Osteopenia.  IMPRESSION: Limited positioning.  No definite fracture or dislocation of the left knee.  Status post left total knee arthroplasty.    Original Report Authenticated By: Jearld Lesch, M.D.    Microbiology: Recent Results (from the past 240 hour(s))  URINE CULTURE     Status: None   Collection Time    11/29/13  9:36 PM      Result Value Range Status   Specimen Description URINE, CATHETERIZED   Final   Special Requests NONE   Final   Culture  Setup Time     Final   Value: 11/30/2013 09:01     Performed at [facility] Count     Final   Value: >=100,000 COLONIES/ML     Performed at [facility]   Culture     Final   Value: ESCHERICHIA COLI     Performed at [facility]   Report Status 12/02/2013 FINAL   Final   Organism ID, Bacteria ESCHERICHIA COLI   Final     Labs: Basic Metabolic Panel:  Recent Labs Lab 11/29/13 2005 11/30/13 1120 12/02/13 0055 12/02/13 0459  NA 138 142 142 144  K 3.2* 3.7 3.7 3.5  CL 100 105 110 110  CO2 29 22 23 25   GLUCOSE 138* 111* 125* 98  BUN 21 19 13 13   CREATININE 0.70 0.49* 0.42* 0.48*  CALCIUM 8.9 8.7 7.9* 8.4   Liver Function Tests: No results found for this basename: AST, ALT, ALKPHOS, BILITOT, PROT, ALBUMIN,  in the last 168 hours No results found for this basename: LIPASE, AMYLASE,  in the last 168 hours No results found for this basename: AMMONIA,  in the last 168 hours CBC:  Recent Labs Lab 11/29/13 2005 12/01/13 1010 12/02/13 0459  WBC 6.7 5.5 6.1  NEUTROABS 4.0  --   --   HGB 13.4 12.8 10.2*  HCT 39.1 38.6 30.8*  MCV 97.3 99.7 99.4  PLT 173 160 159   Cardiac Enzymes: No results found for this basename: CKTOTAL, CKMB, CKMBINDEX, TROPONINI,  in the last 168 hours BNP: BNP (last 3 results) No results found for this basename: PROBNP,  in the last 8760 hours CBG: No results found for this basename: GLUCAP,  in the last 168 hours     Signed:  Edsel Petrin  [facility] 12/03/2013, 10:20 AM

## 2013-12-03 NOTE — Brief Op Note (Signed)
11/29/2013 - 12/01/2013  5:04 PM  PATIENT:  Jenna Barber  79 y.o. female  PRE-OPERATIVE DIAGNOSIS:  left femoral neck fracture  POST-OPERATIVE DIAGNOSIS:  left femoral neck fracture  PROCEDURE:  Procedure(s): Hemi-Arthroplasty Left Hip (Left)  SURGEON:  Surgeon(s) and Role:    * Harvie Junior, MD - Primary  PHYSICIAN ASSISTANT:   ASSISTANTS: bethune   ANESTHESIA:   general  EBL:  Total I/O In: 220 [P.O.:220] Out: -   BLOOD ADMINISTERED:none  DRAINS: none   LOCAL MEDICATIONS USED:  NONE  SPECIMEN:  No Specimen  DISPOSITION OF SPECIMEN:  N/A  COUNTS:  YES  TOURNIQUET:  * No tourniquets in log *  DICTATION: .Other Dictation: Dictation Number [id number]  PLAN OF CARE: Admit to inpatient   PATIENT DISPOSITION:  PACU - hemodynamically stable.   Delay start of Pharmacological VTE agent (>24hrs) due to surgical blood loss or risk of bleeding: no

## 2013-12-03 NOTE — Progress Notes (Signed)
Clinical social worker assisted with patient discharge to skilled nursing facility, [facility].  CSW addressed all family questions and concerns. CSW copied chart and added all important documents. CSW also set up patient transportation with [facility]. Clinical Social Worker will sign off for now as social work intervention is no longer needed.   Tiwanna Tuch, MSW, LCSWA [phone] 

## 2013-12-03 NOTE — Progress Notes (Signed)
ANTICOAGULATION CONSULT NOTE - Follow Up Consult  Pharmacy Consult for Warfarin Indication: Hx DVT  Allergies  Allergen Reactions  . Bextra [Valdecoxib]   . Clindamycin/Lincomycin   . Codeine   . Diclofenac   . Methadone   . Mobic [Meloxicam]   . Naproxen   . Penicillins   . Sulfa Antibiotics     Patient Measurements: Height: 5\' 7"  (170.2 cm) Weight: 180 lb (81.647 kg) IBW/kg (Calculated) : 61.6  Vital Signs: Temp: 98.4 F (36.9 C) (12/04 0600) Temp src: Oral (12/04 0600) BP: 121/52 mmHg (12/04 0600) Pulse Rate: 385 (12/04 0600)  Labs:  Recent Labs  12/01/13 1010 12/02/13 0055 12/02/13 0459 12/03/13 0814  HGB 12.8  --  10.2*  --   HCT 38.6  --  30.8*  --   PLT 160  --  159  --   LABPROT 14.6  --  15.1 15.3*  INR 1.16  --  1.22 1.24  CREATININE  --  0.42* 0.48*  --     Estimated Creatinine Clearance: 59.6 ml/min (by C-G formula based on Cr of 0.48).   Assessment: 79 y.o. M on warfarin PTA for hx DVT -- however held on admit and reversed for L-hip fracture repair, done on 12/2. INR today remains SUBtherapeutic (INR 1.24 << 1.22, goal of 2-3). No CBC today - Hgb/Hct drop on 12/3, plts wnl. No overt s/sx of bleeding noted at this time.  Attempted warfarin re-education with the patient on 12/3 however she could not comprehend and/or follow along with the conversation. Pt noted to be discharged to [facility] today.  Goal of Therapy:  INR 2-3   Plan:  1. Warfarin 3 mg x 1 dose at 1800 today (if still here) 2. Upon discharge would recommend warfarin 3 mg today (12/4), then to resume home dose of 2 mg daily starting on 12/5. 3. Will continue to monitor for any signs/symptoms of bleeding and will follow up with PT/INR in the a.m.   Georgina Pillion, PharmD, BCPS Clinical Pharmacist Pager: [phone] 12/03/2013 12:15 PM

## 2013-12-03 NOTE — Clinical Social Work Psychosocial (Addendum)
Clinical Social Work Department  BRIEF PSYCHOSOCIAL ASSESSMENT  Patient:Jenna Barber Account Number: [id number]  Admit date: 11/29/13 Clinical Social Worker Sabino Niemann, MSW Date/Time: 11/30/13 Referred by: Physician Date Referred: 11/29/13 Referred for   SNF Placement- FROM [facility]  Other Referral:  Interview type: Patient  Other interview type: PSYCHOSOCIAL DATA  Living Status: Admitted from facility: [facility] Level of care: SNF Primary support name: [facility]  Primary support relationship to patient: UNKNOWN Degree of support available:  FAIR  CURRENT CONCERNS  Current Concerns   Post-Acute Placement   Other Concerns:  SOCIAL WORK ASSESSMENT / PLAN  CSW met with pt re: PT recommendation for SNF.   Pt lives AT [facility] AND IS AGREEABLE TO RETURNING      CSW completed FL2    Assessment/plan status: Information/Referral to [facility]  Other assessment/ plan:  Information/referral to community resources:  SNF   PTAR  PATIENT'S/FAMILY'S RESPONSE TO PLAN OF CARE:  Pt  reports she is agreeable to RETURNING TO [facility] Pt verbalized understanding of placement process and appreciation for CSW assist.   Sabino Niemann, MSW, LCSWA [phone]

## 2013-12-03 NOTE — Progress Notes (Signed)
Subjective: 2 Days Post-Op Procedure(s) (LRB): Hemi-Arthroplasty Left Hip (Left) Patient reports pain as 1 on 0-10 scale.   She pulled off her hip dressing. Objective: Vital signs in last 24 hours: Temp:  [98.4 F (36.9 C)-98.6 F (37 C)] 98.4 F (36.9 C) (12/04 0600) Pulse Rate:  [91-385] 385 (12/04 0600) Resp:  [16-19] 19 (12/04 0600) BP: (112-125)/(46-52) 121/52 mmHg (12/04 0600) SpO2:  [98 %-100 %] 98 % (12/04 0600)  Intake/Output from previous day: 12/03 0701 - 12/04 0700 In: 930 [P.O.:330; I.V.:600] Out: -  Intake/Output this shift:     Recent Labs  12/01/13 1010 12/02/13 0459  HGB 12.8 10.2*    Recent Labs  12/01/13 1010 12/02/13 0459  WBC 5.5 6.1  RBC 3.87 3.10*  HCT 38.6 30.8*  PLT 160 159    Recent Labs  12/02/13 0055 12/02/13 0459  NA 142 144  K 3.7 3.5  CL 110 110  CO2 23 25  BUN 13 13  CREATININE 0.42* 0.48*  GLUCOSE 125* 98  CALCIUM 7.9* 8.4    Recent Labs  12/01/13 1010 12/02/13 0459  INR 1.16 1.22   Left hip exam: Neurovascular intact Sensation intact distally Intact pulses distally Incision: no drainage No cellulitis present Compartment soft  Assessment/Plan: 2 Days Post-Op Procedure(s) (LRB): Hemi-Arthroplasty Left Hip (Left) \Plan: Discharge to SNF when bed available. F/U with Dr. Luiz Blare in 2 weeks.  Shaquna Geigle G 12/03/2013, 8:48 AM

## 2013-12-04 ENCOUNTER — Non-Acute Institutional Stay (SKILLED_NURSING_FACILITY): Payer: Medicare Other | Admitting: Adult Health

## 2013-12-04 DIAGNOSIS — G589 Mononeuropathy, unspecified: Secondary | ICD-10-CM

## 2013-12-04 DIAGNOSIS — F329 Major depressive disorder, single episode, unspecified: Secondary | ICD-10-CM

## 2013-12-04 DIAGNOSIS — N390 Urinary tract infection, site not specified: Secondary | ICD-10-CM

## 2013-12-04 DIAGNOSIS — S72009A Fracture of unspecified part of neck of unspecified femur, initial encounter for closed fracture: Secondary | ICD-10-CM

## 2013-12-04 DIAGNOSIS — R609 Edema, unspecified: Secondary | ICD-10-CM

## 2013-12-04 DIAGNOSIS — G629 Polyneuropathy, unspecified: Secondary | ICD-10-CM

## 2013-12-04 DIAGNOSIS — F0280 Dementia in other diseases classified elsewhere without behavioral disturbance: Secondary | ICD-10-CM

## 2013-12-04 DIAGNOSIS — K5900 Constipation, unspecified: Secondary | ICD-10-CM

## 2013-12-04 DIAGNOSIS — R600 Localized edema: Secondary | ICD-10-CM

## 2013-12-04 NOTE — Op Note (Signed)
Jenna Barber, Jenna Barber                   ACCOUNT NO.:  [id number]  MEDICAL RECORD NO.:  [id number]  LOCATION:  [id number]                        FACILITY:  [facility]  PHYSICIAN:  Harvie Junior, M.D.   DATE OF BIRTH:  Jan 04, 1929  DATE OF PROCEDURE:  12/02/2013 DATE OF DISCHARGE:  12/03/2013                              OPERATIVE REPORT   PREOPERATIVE DIAGNOSIS:  Femoral neck fracture, left.  POSTOPERATIVE DIAGNOSIS:  Femoral neck fracture, left.  PROCEDURE:  Left hemiarthroplasty with a Summit basic stem with a size 46-mm monopolar hip ball +0.  SURGEON:  Harvie Junior, MD  ASSISTANT:  Marshia Ly, PA  ANESTHESIA:  General.  BRIEF HISTORY:  [name] is an 79 year old female with a history of having a fall onto her left hip.  She is essentially a non-ambulator and because of complaints of pain and inability to be moved into bed, we felt that a hemiarthroplasty was appropriate.  After long discussion with her and her family, she was ultimately taken to the operating room for left hemiarthroplasty.  DESCRIPTION OF PROCEDURE:  The patient was taken to the operating room. After adequate anesthesia was obtained with general anesthetic, the patient was placed supine on the operating table.  The patient was then placed in the right lateral decubitus position.  All bony prominences were well padded.  Attention was the turned to the left hip.  After routine prep and drape, an incision was made for a posterior approach to the hip, subcutaneous tissue down to the level of the tensor fascia which was divided in line with its fibers.  The gluteus maximus fascia was released on the superior and inferior border, and finger fractured. Charnley retractors put in place.  The leg was held in internally rotated position and Cobra retractors were put in place.  The short external rotators were taken down as well as piriformis and these were tagged as well as posterior capsule.  Once this was done, a  provisional neck cut was made.  Once that was done, the head was removed in total and measured in multiple planes and felt to be a 46.  At this point, a ball and a stick 46 was placed into the acetabulum, put through a range of motion, and this gave [place] excellent range of motion.  This was removed.  Attention was then turned to the stem side where the canal finder was used followed by lateralizer and following this, the canal was sequentially reamed to a level of 4.  We trialed with a +0 ball, got excellent range of motion and stability.  When we came back to retest, it appeared as though there was a little bit of proximal motion with a 4, we put a 5 and got excellent tight fit up top and felt that press-fit was appropriate and at this point, a size 5 stem was opened and was placed.  We trialed again with a +0 ball.  Excellent range of motion and stability.  Then, we opened a 46-mm +0 hip ball.  This was placed, and the patient was then reduced.  Short external rotators and piriformis were repaired to the posterior intertrochanteric line through  drill holes.  The tensor fascia was closed with 1 Vicryl running, the skin with 0 and 2-0 Vicryl and skin staples.  Sterile compressive dressing was applied, and the patient was taken to the recovery room, she was noted to be in satisfactory condition.  She was placed in a knee immobilizer to help remind her not to flex the knee up, putting the hip at risk for dislocation.  The estimated blood loss for the procedure was 300 mL.     Harvie Junior, M.D.     Ranae Plumber  D:  12/03/2013  T:  12/04/2013  Job:  [id number]

## 2013-12-09 ENCOUNTER — Non-Acute Institutional Stay (SKILLED_NURSING_FACILITY): Payer: Medicare Other | Admitting: Internal Medicine

## 2013-12-09 DIAGNOSIS — F0280 Dementia in other diseases classified elsewhere without behavioral disturbance: Secondary | ICD-10-CM

## 2013-12-09 DIAGNOSIS — S72009A Fracture of unspecified part of neck of unspecified femur, initial encounter for closed fracture: Secondary | ICD-10-CM

## 2013-12-09 DIAGNOSIS — N390 Urinary tract infection, site not specified: Secondary | ICD-10-CM

## 2013-12-09 DIAGNOSIS — G609 Hereditary and idiopathic neuropathy, unspecified: Secondary | ICD-10-CM

## 2013-12-30 ENCOUNTER — Other Ambulatory Visit: Payer: Self-pay | Admitting: *Deleted

## 2013-12-30 MED ORDER — OXYCODONE-ACETAMINOPHEN 5-325 MG PO TABS: ORAL_TABLET | ORAL | Status: DC

## 2014-01-28 ENCOUNTER — Non-Acute Institutional Stay (SKILLED_NURSING_FACILITY): Payer: Medicare Other | Admitting: Adult Health

## 2014-01-28 DIAGNOSIS — G309 Alzheimer's disease, unspecified: Secondary | ICD-10-CM

## 2014-01-28 DIAGNOSIS — F3289 Other specified depressive episodes: Secondary | ICD-10-CM

## 2014-01-28 DIAGNOSIS — K5900 Constipation, unspecified: Secondary | ICD-10-CM | POA: Insufficient documentation

## 2014-01-28 DIAGNOSIS — G629 Polyneuropathy, unspecified: Secondary | ICD-10-CM

## 2014-01-28 DIAGNOSIS — F32A Depression, unspecified: Secondary | ICD-10-CM

## 2014-01-28 DIAGNOSIS — F0280 Dementia in other diseases classified elsewhere without behavioral disturbance: Secondary | ICD-10-CM

## 2014-01-28 DIAGNOSIS — R600 Localized edema: Secondary | ICD-10-CM

## 2014-01-28 DIAGNOSIS — R609 Edema, unspecified: Secondary | ICD-10-CM

## 2014-01-28 DIAGNOSIS — F329 Major depressive disorder, single episode, unspecified: Secondary | ICD-10-CM

## 2014-01-28 DIAGNOSIS — G589 Mononeuropathy, unspecified: Secondary | ICD-10-CM

## 2014-01-28 NOTE — Progress Notes (Signed)
Patient ID: Jenna Barber, female   DOB: 09/04/1929, 80 y.o.   MRN: [id number]          [place]ESS [place]  DATE: 01/28/14  FACILITY: Nursing Home Location: [facility]  LEVEL OF CARE: SNF (31)  Routine Visit  CHIEF COMPLAINT:  Manage Alzheimer's Disease, Neuropathy and Depression  HISTORY OF PRESENT ILLNESS:   REASSESSMENT OF ONGOING PROBLEM(S):  PERIPHERAL NEUROPATHY: The peripheral neuropathy is stable. The patient denies pain in the feet, tingling, and numbness. No complications noted from the medication presently being used.  DEMENTIA: The dementia remaines stable and continues to function adequately in the current living environment with supervision.  The patient has had little changes in behavior. No complications noted from the medications presently being used.  DEPRESSION: The depression remains stable. Patient denies ongoing feelings of sadness, insomnia, anedhonia or lack of appetite. No complications reported from the medications currently being used. Staff do not report behavioral problems.   PAST MEDICAL HISTORY : Reviewed.  No changes.  [facility]S: [facility]  REVIEW OF SYSTEMS: Unobtainable due to patient being a poor historian  PHYSICAL EXAMINATION  VS:  T97.2       P77     RR20      BP 118/56     POX % 95    WT167.6 (Lb)  GENERAL: no acute distress, obese body habitus NECK: supple, trachea midline, no neck masses, no thyroid tenderness, no thyromegaly RESPIRATORY: breathing is even & unlabored, BS CTAB CARDIAC: RRR, no murmur,no extra heart sounds, BLE edema, 1+ GI: abdomen soft, normal BS, no masses, no tenderness, no hepatomegaly, no splenomegaly PSYCHIATRIC: the patient is alert & oriented to person, affect & behavior appropriate  LABS/RADIOLOGY:  Labs reviewed: Basic Metabolic Panel:  Recent Labs  40/98/1111/01/13 1120 12/02/13 0055 12/02/13 0459  NA 142 142 144  K 3.7 3.7 3.5  CL 105 110 110  CO2 22 23 25   GLUCOSE 111*  125* 98  BUN 19 13 13   CREATININE 0.49* 0.42* 0.48*  CALCIUM 8.7 7.9* 8.4   CBC:  Recent Labs  11/29/13 2005 12/01/13 1010 12/02/13 0459  WBC 6.7 5.5 6.1  NEUTROABS 4.0  --   --   HGB 13.4 12.8 10.2*  HCT 39.1 38.6 30.8*  MCV 97.3 99.7 99.4  PLT 173 160 159   5/14 WBC 3.9, dobutamine 11.9, MCV 96.5, platelets 147, total protein 5.2, albumin 2.9 otherwise CMP normal    ASSESSMENT/PLAN:  Alzheimer's dementia-stable; continue Aricept and Namenda Peripheral neuropathy-denies ongoing symptoms; continue Neurontin Constipation-well controlled. Depression-continue Remeron. Edema of extremities - continue Edecrine    CPT CODE: [id number]

## 2014-01-28 NOTE — Progress Notes (Signed)
Patient ID: Jenna Barber, female   DOB: 12/28/1929, 80 y.o.   MRN: [id number]         PROGRESS NOTE  DATE: 12/04/13  FACILITY: Nursing Home [facility]  LEVEL OF CARE: SNF (31)  Acute Visit  CHIEF COMPLAINT:  Follow-up Hospitalization  HISTORY OF PRESENT ILLNESS: This is an 80 year old female who has [facility] with principal discharge diagnosis of Left femoral neck fracture S/P Left hip hemiarthroplasty. She has been admitted for a short-term rehabilitation.   REASSESSMENT OF ONGOING PROBLEM(S):  DEMENTIA: The dementia remaines stable and continues to function adequately in the current living environment with supervision.  The patient has had little changes in behavior. No complications noted from the medications presently being used.  DEPRESSION: The depression remains stable. Patient denies ongoing feelings of sadness, insomnia, anedhonia or lack of appetite. No complications reported from the medications currently being used. Staff do not report behavioral problems.  UTI: The UTI remains stable.  The patient denies ongoing suprapubic pain, flank pain, dysuria, urinary frequency, urinary hesitancy or hematuria.  No complications reported from the current antibiotic being used.  PAST MEDICAL HISTORY : Reviewed.  No changes.  CURRENT MEDICATIONS: [facility]  REVIEW OF SYSTEMS: Unobtainable due to patient being a poor historian  PHYSICAL EXAMINATION  VS:  T99.3       P58      RR20      BP 118/66     POX % 95    WT167.3 (Lb)  GENERAL: no acute distress, obese body habitus EYES: PERRL, conj clear, no exudate NECK: supple, trachea midline, no neck masses, no thyroid tenderness, no thyromegaly RESPIRATORY: breathing is even & unlabored, BS CTAB CARDIAC: RRR, no murmur,no extra heart sounds, no edema GI: abdomen soft, normal BS, no masses, no tenderness, no hepatomegaly, no  splenomegaly PSYCHIATRIC: the patient is alert & oriented to person, affect & behavior appropriate  LABS/RADIOLOGY:  Labs reviewed: Basic Metabolic Panel:  Recent Labs  40/98/1111/01/13 1120 12/02/13 0055 12/02/13 0459  NA 142 142 144  K 3.7 3.7 3.5  CL 105 110 110  CO2 22 23 25   GLUCOSE 111* 125* 98  BUN 19 13 13   CREATININE 0.49* 0.42* 0.48*  CALCIUM 8.7 7.9* 8.4   CBC:  Recent Labs  11/29/13 2005 12/01/13 1010 12/02/13 0459  WBC 6.7 5.5 6.1  NEUTROABS 4.0  --   --   HGB 13.4 12.8 10.2*  HCT 39.1 38.6 30.8*  MCV 97.3 99.7 99.4  PLT 173 160 159   5/14 WBC 3.9, dobutamine 11.9, MCV 96.5, platelets 147, total protein 5.2, albumin 2.9 otherwise CMP normal    ASSESSMENT/PLAN:  Left femoral neck fracture S/P left hip hemiarthroplasty - for rehabilitation Alzheimer's dementia-stable. Peripheral neuropathy-denies ongoing symptoms. Constipation-well controlled. Anemia of chronic disease-stable. Depression-continue Remeron. UTI - continue Keflex    CPT CODE: [id number]

## 2014-02-11 NOTE — Progress Notes (Signed)
Patient ID: Jenna Barber, female   DOB: July 18, 1929, 80 y.o.   MRN: [id number]               HISTORY & PHYSICAL  DATE: 12/09/2013     FACILITY: [facility]  LEVEL OF CARE: SNF (31)  ALLERGIES:  Allergies  Allergen Reactions  . Bextra [Valdecoxib]   . Clindamycin/Lincomycin   . Codeine   . Diclofenac   . Methadone   . Mobic [Meloxicam]   . Naproxen   . Penicillins   . Sulfa Antibiotics     CHIEF COMPLAINT:  Manage left femoral neck fracture, Alzheimer's dementia, and peripheral neuropathy.    HISTORY OF PRESENT ILLNESS:   The patient is an 80 year-old, Caucasian female.    HIP FRACTURE: The patient had a mechanical fall and sustained a femur fracture.  Patient subsequently underwent surgical repair and tolerated the procedure well. Patient is admitted to this facility for short-term rehabilitation. Patient denies hip pain currently. No complications reported from the pain medications currently being used..    DEMENTIA: The dementia remains stable and continues to function adequately in the current living environment with supervision.  The patient has had little changes in behavior. No complications noted from the medications presently being used.    PERIPHERAL NEUROPATHY: The peripheral neuropathy is stable. The patient denies pain in the feet, tingling, and numbness. No complications noted from the medication presently being used.   PAST MEDICAL HISTORY :  Past Medical History  Diagnosis Date  . Alzheimer's dementia   . Parkinson disease   . Depression   . Hypertension   . Arthritis   . Hyperlipemia   . Peripheral neuropathy   . Back pain   . Gout   . Constipation   . Hearing loss   . Insomnia     PAST SURGICAL HISTORY: Past Surgical History  Procedure Laterality Date  . Total knee arthroplasty Bilateral   . Hip arthroplasty Left 12/01/2013    Procedure: Hemi-Arthroplasty Left Hip;  Surgeon: Harvie Junior, MD;  Location: [facility] OR;  Service:  Orthopedics;  Laterality: Left;    SOCIAL HISTORY:  reports that she quit smoking about 20 years ago. Her smoking use included Cigarettes. She smoked 0.00 packs per day. She does not have any smokeless tobacco history on file. She reports that she does not drink alcohol or use illicit drugs.  FAMILY HISTORY: None  CURRENT MEDICATIONS: Reviewed per MAR  REVIEW OF SYSTEMS:  See HPI otherwise 14 point ROS is negative.  PHYSICAL EXAMINATION  VS:  T 97.4       P 95      RR 18      BP 130/62      POX 97%         GENERAL: no acute distress, morbidly obese body habitus EYES: conjunctivae normal, sclerae normal, normal eye lids MOUTH/THROAT: lips without lesions,no lesions in the mouth,tongue is without lesions,uvula elevates in midline NECK: supple, trachea midline, no neck masses, no thyroid tenderness, no thyromegaly LYMPHATICS: no LAN in the neck, no supraclavicular LAN RESPIRATORY: breathing is even & unlabored, BS CTAB CARDIAC: RRR, no murmur,no extra heart sounds EDEMA/VARICOSITIES:  +2 bilateral lower extremity edema  ARTERIAL:  pedal pulses nonpalpable   GI:  ABDOMEN: abdomen soft, normal BS, no masses, no tenderness  LIVER/SPLEEN: no hepatomegaly, no splenomegaly MUSCULOSKELETAL: HEAD: normal to inspection & palpation BACK: no kyphosis, scoliosis or spinal processes tenderness EXTREMITIES: LEFT UPPER EXTREMITY: strength decreased,  range of motion moderate   RIGHT UPPER EXTREMITY: strength decreased, range of motion moderate   LEFT LOWER EXTREMITY: unable to assess   RIGHT LOWER EXTREMITY: unable to assess   PSYCHIATRIC: the patient is alert & oriented to person, affect & behavior appropriate  LABS/RADIOLOGY: Chest x-ray:  No acute disease.    Left hip x-ray:  Showed displaced left femoral neck fracture.    Pelvic x-ray postoperatively:  Showed left femoral hemiarthroplasty.    Left hip x-ray postsurgically:  Showed left femoral hemiarthroplasty.     Left knee x-ray:   Showed left total knee arthroplasty.    Urine culture grew E.coli significantly.    BMP normal.    Labs reviewed: Basic Metabolic Panel:  Recent Labs  16/10/9611/01/14 1120 12/02/13 0055 12/02/13 0459  NA 142 142 144  K 3.7 3.7 3.5  CL 105 110 110  CO2 22 23 25   GLUCOSE 111* 125* 98  BUN 19 13 13   CREATININE 0.49* 0.42* 0.48*  CALCIUM 8.7 7.9* 8.4   CBC:  Recent Labs  11/29/13 2005 12/01/13 1010 12/02/13 0459  WBC 6.7 5.5 6.1  NEUTROABS 4.0  --   --   HGB 13.4 12.8 10.2*  HCT 39.1 38.6 30.8*  MCV 97.3 99.7 99.4  PLT 173 160 159   ASSESSMENT/PLAN:  Left femoral neck fracture.  Status post left hip hemiarthroplasty.  Continue rehabilitation.    Alzheimer's dementia.  Stable.    Peripheral neuropathy.  Continue Neurontin.    UTI.  Continue Keflex.    Constipation.  Well controlled.    DVT.  Continue Coumadin.    THN Metrics:   Not on aspirin.  Former smoker.    I have reviewed patient's medical records received at admission/from hospitalization.  CPT CODE: [id number]

## 2014-02-12 ENCOUNTER — Encounter: Payer: Self-pay | Admitting: Internal Medicine

## 2014-02-16 ENCOUNTER — Other Ambulatory Visit: Payer: Self-pay | Admitting: *Deleted

## 2014-02-16 MED ORDER — OXYCODONE-ACETAMINOPHEN 5-325 MG PO TABS: ORAL_TABLET | ORAL | Status: DC

## 2014-02-16 NOTE — Telephone Encounter (Signed)
[facility] 

## 2014-04-15 ENCOUNTER — Encounter: Payer: Self-pay | Admitting: Adult Health

## 2014-04-15 ENCOUNTER — Non-Acute Institutional Stay (SKILLED_NURSING_FACILITY): Payer: Medicare Other | Admitting: Adult Health

## 2014-04-15 DIAGNOSIS — G589 Mononeuropathy, unspecified: Secondary | ICD-10-CM

## 2014-04-15 DIAGNOSIS — G629 Polyneuropathy, unspecified: Secondary | ICD-10-CM

## 2014-04-15 DIAGNOSIS — F329 Major depressive disorder, single episode, unspecified: Secondary | ICD-10-CM

## 2014-04-15 DIAGNOSIS — F3289 Other specified depressive episodes: Secondary | ICD-10-CM

## 2014-04-15 DIAGNOSIS — G309 Alzheimer's disease, unspecified: Secondary | ICD-10-CM

## 2014-04-15 DIAGNOSIS — M1990 Unspecified osteoarthritis, unspecified site: Secondary | ICD-10-CM

## 2014-04-15 DIAGNOSIS — Z86718 Personal history of other venous thrombosis and embolism: Secondary | ICD-10-CM

## 2014-04-15 DIAGNOSIS — F0280 Dementia in other diseases classified elsewhere without behavioral disturbance: Secondary | ICD-10-CM

## 2014-04-15 DIAGNOSIS — F32A Depression, unspecified: Secondary | ICD-10-CM | POA: Insufficient documentation

## 2014-04-15 NOTE — Progress Notes (Signed)
Patient ID: Jenna Barber, female   DOB: 04/24/1929, 80 y.o.   MRN: [id number]          PROGRESS NOTE  DATE: 04/15/14  FACILITY: Nursing Home Location: [facility]  LEVEL OF CARE: SNF (31)  Routine Visit  CHIEF COMPLAINT:  Manage Alzheimer's Disease, Neuropathy and Depression  HISTORY OF PRESENT ILLNESS:   REASSESSMENT OF ONGOING PROBLEM(S):  PERIPHERAL NEUROPATHY: The peripheral neuropathy is stable. The patient denies pain in the feet, tingling, and numbness. No complications noted from the medication presently being used.  DEMENTIA: The dementia remaines stable and continues to function adequately in the current living environment with supervision.  The patient has had little changes in behavior. No complications noted from the medications presently being used.  DEPRESSION: The depression remains stable. Patient denies ongoing feelings of sadness, insomnia, anedhonia or lack of appetite. No complications reported from the medications currently being used. Staff do not report behavioral problems.   PAST MEDICAL HISTORY : Reviewed.  No changes.  CURRENT MEDICATIONS: [facility]  REVIEW OF SYSTEMS: Unobtainable due to patient being a poor historian  PHYSICAL EXAMINATION  GENERAL: no acute distress, obese body habitus NECK: supple, trachea midline, no neck masses, no thyroid tenderness, no thyromegaly LYMPHATIC: No adenopathy in the cervical, supraclavicular and axillary areas RESPIRATORY: breathing is even & unlabored, BS CTAB CARDIAC: RRR, no murmur,no extra heart sounds,  GI: abdomen soft, normal BS, no masses, no tenderness, no hepatomegaly, no splenomegaly PSYCHIATRIC: the patient is alert & oriented to person, affect & behavior appropriate  LABS/RADIOLOGY:  Labs reviewed: Basic Metabolic Panel:  Recent Labs  95/62/1311/01/13 1120 12/02/13 0055 12/02/13 0459  NA 142 142 144  K 3.7 3.7 3.5  CL 105 110 110  CO2 22 23 25   GLUCOSE 111* 125* 98  BUN  19 13 13   CREATININE 0.49* 0.42* 0.48*  CALCIUM 8.7 7.9* 8.4   CBC:  Recent Labs  11/29/13 2005 12/01/13 1010 12/02/13 0459  WBC 6.7 5.5 6.1  NEUTROABS 4.0  --   --   HGB 13.4 12.8 10.2*  HCT 39.1 38.6 30.8*  MCV 97.3 99.7 99.4  PLT 173 160 159   5/14 WBC 3.9, dobutamine 11.9, MCV 96.5, platelets 147, total protein 5.2, albumin 2.9 otherwise CMP normal    ASSESSMENT/PLAN:  Alzheimer's dementia-stable; continue Aricept and Namenda Peripheral neuropathy-denies ongoing symptoms; continue Neurontin Constipation-well controlled. Depression-continue Remeron. Hx of DVT - continue Coumadin and INR checks Osteoarthrosis - continue Lidoderm and Percocet; recently reduced Percocet dosage   CPT CODE: [id number]   Ella BodoMonina Vargas - NP [facility] [phone]

## 2014-04-19 ENCOUNTER — Other Ambulatory Visit: Payer: Self-pay | Admitting: *Deleted

## 2014-04-19 MED ORDER — OXYCODONE-ACETAMINOPHEN 5-325 MG PO TABS: ORAL_TABLET | ORAL | Status: DC

## 2014-04-19 NOTE — Telephone Encounter (Signed)
[facility] 

## 2014-05-04 ENCOUNTER — Non-Acute Institutional Stay (SKILLED_NURSING_FACILITY): Payer: Medicare Other | Admitting: Adult Health

## 2014-05-04 ENCOUNTER — Encounter: Payer: Self-pay | Admitting: Adult Health

## 2014-05-04 DIAGNOSIS — Z7901 Long term (current) use of anticoagulants: Secondary | ICD-10-CM

## 2014-05-04 DIAGNOSIS — I82409 Acute embolism and thrombosis of unspecified deep veins of unspecified lower extremity: Secondary | ICD-10-CM

## 2014-05-20 ENCOUNTER — Non-Acute Institutional Stay (SKILLED_NURSING_FACILITY): Payer: Medicare Other | Admitting: Adult Health

## 2014-05-20 ENCOUNTER — Encounter: Payer: Self-pay | Admitting: Adult Health

## 2014-05-20 DIAGNOSIS — Z86718 Personal history of other venous thrombosis and embolism: Secondary | ICD-10-CM

## 2014-05-20 DIAGNOSIS — G629 Polyneuropathy, unspecified: Secondary | ICD-10-CM

## 2014-05-20 DIAGNOSIS — G589 Mononeuropathy, unspecified: Secondary | ICD-10-CM

## 2014-05-20 DIAGNOSIS — F329 Major depressive disorder, single episode, unspecified: Secondary | ICD-10-CM

## 2014-05-20 DIAGNOSIS — F3289 Other specified depressive episodes: Secondary | ICD-10-CM

## 2014-05-20 DIAGNOSIS — K5900 Constipation, unspecified: Secondary | ICD-10-CM

## 2014-05-20 DIAGNOSIS — F0280 Dementia in other diseases classified elsewhere without behavioral disturbance: Secondary | ICD-10-CM

## 2014-05-20 DIAGNOSIS — F32A Depression, unspecified: Secondary | ICD-10-CM

## 2014-05-20 DIAGNOSIS — G309 Alzheimer's disease, unspecified: Secondary | ICD-10-CM

## 2014-05-20 DIAGNOSIS — M1990 Unspecified osteoarthritis, unspecified site: Secondary | ICD-10-CM

## 2014-05-20 NOTE — Progress Notes (Signed)
Patient ID: Jenna Barber, female   DOB: 06/25/1929, 80 y.o.   MRN: [id number]    Subjective:     Indication: DVT Bleeding signs/symptoms: None Thromboembolic signs/symptoms: None  Missed Coumadin doses: None Medication changes: no Dietary changes: no Bacterial/viral infection: no Other concerns: no   Review of Systems A comprehensive review of systems was negative.   Objective:    INR Today: 1.8 Current dose: Coumadin 3.mg by mouth daily  Assessment:   subtherapeutic INR for goal of  2-3.5  Plan:    1. New dose:   Coumadin to 3.5 mg PO Q D  -new 2. Next INR: 05/07/14

## 2014-05-20 NOTE — Progress Notes (Signed)
Patient ID: Jenna Barber, female   DOB: 05/12/1929, 80 y.o.   MRN: [id number]         PROGRESS NOTE  DATE: 05/20/14  FACILITY: Nursing Home [facility]  LEVEL OF CARE: SNF (31)  Routine Visit  CHIEF COMPLAINT:  Manage Alzheimer's Disease, Neuropathy and Depression  HISTORY OF PRESENT ILLNESS:   REASSESSMENT OF ONGOING PROBLEM(S):  CONSTIPATION: The constipation remains stable. No complications from the medications presently being used. Patient denies ongoing constipation, abdominal pain, nausea or vomiting.  PERIPHERAL NEUROPATHY: The peripheral neuropathy is stable. The patient denies pain in the feet, tingling, and numbness. No complications noted from the medication presently being used.   PAST MEDICAL HISTORY : Reviewed.  No changes.  CURRENT MEDICATIONS: [facility]  REVIEW OF SYSTEMS: Unobtainable due to patient being a poor historian  PHYSICAL EXAMINATION  GENERAL: no acute distress, obese body habitus NECK: supple, trachea midline, no neck masses, no thyroid tenderness, no thyromegaly RESPIRATORY: breathing is even & unlabored, BS CTAB CARDIAC: RRR, no murmur,no extra heart sounds,  GI: abdomen soft, normal BS, no masses, no tenderness, no hepatomegaly, no splenomegaly EXTREMITIES:  Generalized weakness of BLE PSYCHIATRIC: the patient is alert & oriented to person, affect & behavior appropriate  LABS/RADIOLOGY: 05/06/14  NA 141  K  4.1  Glucose 71  BUN 11  Creatinine 0.6  CA 8.8 Labs reviewed: Basic Metabolic Panel:  Recent Labs  40/98/1111/01/13 1120 12/02/13 0055 12/02/13 0459  NA 142 142 144  K 3.7 3.7 3.5  CL 105 110 110  CO2 22 23 25   GLUCOSE 111* 125* 98  BUN 19 13 13   CREATININE 0.49* 0.42* 0.48*  CALCIUM 8.7 7.9* 8.4   CBC:  Recent Labs  11/29/13 2005 12/01/13 1010 12/02/13 0459  WBC 6.7 5.5 6.1  NEUTROABS 4.0  --   --   HGB 13.4 12.8 10.2*  HCT 39.1 38.6 30.8*  MCV 97.3 99.7 99.4  PLT 173 160 159   5/14 WBC  3.9, dobutamine 11.9, MCV 96.5, platelets 147, total protein 5.2, albumin 2.9 otherwise CMP normal    ASSESSMENT/PLAN:  Alzheimer's dementia-stable; continue Aricept and Namenda Peripheral neuropathy-denies ongoing symptoms; continue Neurontin Constipation-well controlled; continue Miralax, Colace and Senna-S Depression-continue Remeron. Hx of DVT - continue Coumadin and INR checks Osteoarthrosis - continue Lidoderm and Percocet; recently reduced Percocet dosage   CPT CODE: [id number]   Ella BodoMonina Vargas - NP [facility] [phone]

## 2014-05-30 IMAGING — CR DG KNEE COMPLETE 4+V*L*
2 series · 2 of 2 positions shown · non-contrast
Comparison: None.

CLINICAL DATA: Left knee pain

LEFT KNEE - COMPLETE 4+ VIEW

[t knee oblique left]
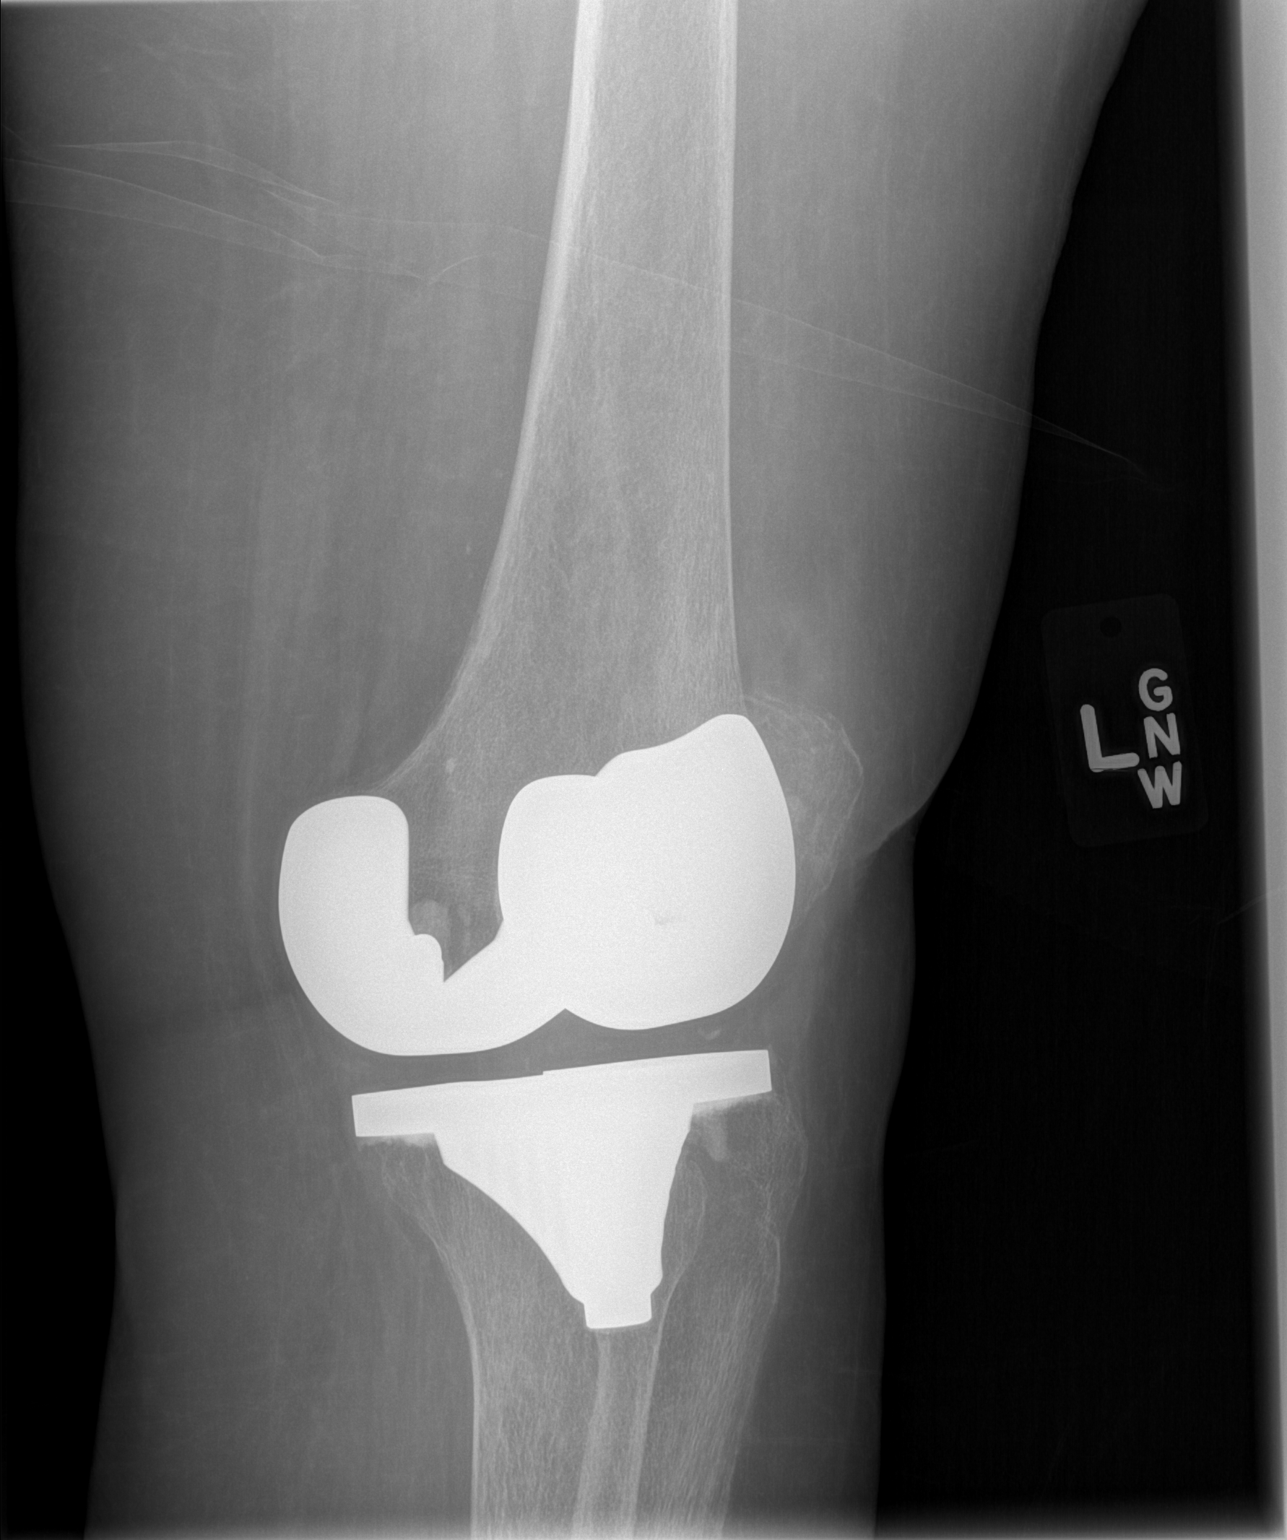

[t knee ap left]
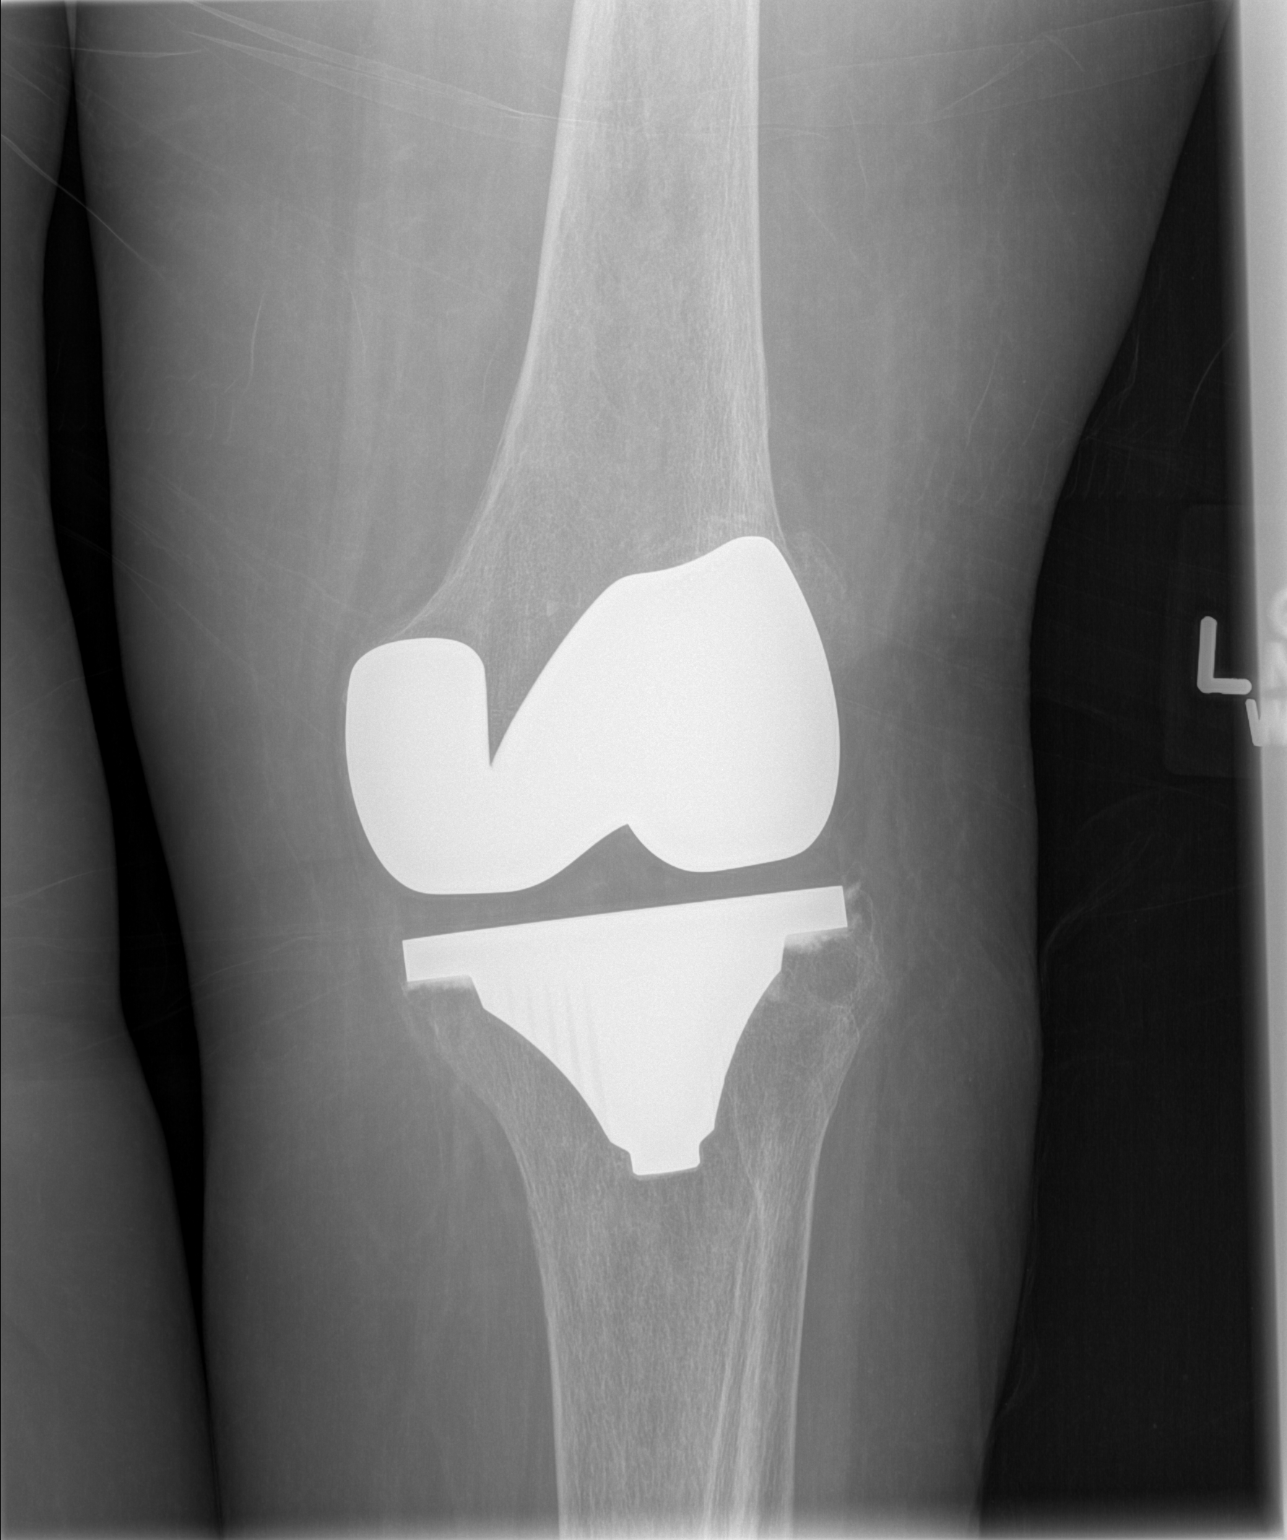

[2 of 2 positions shown; findings below may reference images not displayed]

FINDINGS: Limited positioning due to a left femoral neck fracture
described on the contemporaneous left hip report. Status post left
total knee arthroplasty.  No periprosthetic lucency.  No displaced
acute fracture or dislocation appreciated.  No joint effusion.
Osteopenia.
IMPRESSION: Limited positioning.  No definite fracture or dislocation of the
left knee.  Status post left total knee arthroplasty.

## 2014-06-02 ENCOUNTER — Other Ambulatory Visit: Payer: Self-pay | Admitting: *Deleted

## 2014-06-02 MED ORDER — OXYCODONE-ACETAMINOPHEN 5-325 MG PO TABS: ORAL_TABLET | ORAL | Status: DC

## 2014-06-02 NOTE — Telephone Encounter (Signed)
[facility] 

## 2014-06-03 ENCOUNTER — Encounter: Payer: Self-pay | Admitting: *Deleted

## 2014-06-07 ENCOUNTER — Encounter: Payer: Self-pay | Admitting: *Deleted

## 2014-06-10 ENCOUNTER — Non-Acute Institutional Stay (SKILLED_NURSING_FACILITY): Payer: Medicare Other | Admitting: Internal Medicine

## 2014-06-10 DIAGNOSIS — K5900 Constipation, unspecified: Secondary | ICD-10-CM

## 2014-06-10 DIAGNOSIS — G629 Polyneuropathy, unspecified: Secondary | ICD-10-CM

## 2014-06-10 DIAGNOSIS — M1990 Unspecified osteoarthritis, unspecified site: Secondary | ICD-10-CM

## 2014-06-10 DIAGNOSIS — F0280 Dementia in other diseases classified elsewhere without behavioral disturbance: Secondary | ICD-10-CM

## 2014-06-10 DIAGNOSIS — G309 Alzheimer's disease, unspecified: Principal | ICD-10-CM

## 2014-06-10 DIAGNOSIS — G589 Mononeuropathy, unspecified: Secondary | ICD-10-CM

## 2014-06-10 NOTE — Progress Notes (Signed)
        PROGRESS NOTE  DATE: 06-10-14  FACILITY: Nursing Home Location: [facility]  LEVEL OF CARE: SNF (31)  Routine Visit  CHIEF COMPLAINT:  Manage Alzheimer's dementia, osteoarthritis and peripheral neuropathy  HISTORY OF PRESENT ILLNESS:  REASSESSMENT OF ONGOING PROBLEM(S):  DEMENTIA: The dementia remaines stable and continues to function adequately in the current living environment with supervision.  The patient has had little changes in behavior. No complications noted from the medications presently being used.  PERIPHERAL NEUROPATHY: The peripheral neuropathy is stable. The patient denies pain in the feet, tingling, and numbness. No complications noted from the medication presently being used.  ARTHRITIS: Patient's arthritis remains stable.  The patient denies ongoing joint pains, stiffness, swelling, warmth & redness.  No complications reported from the medication(s) currently being used.  PAST MEDICAL HISTORY : Reviewed.  No changes.  CURRENT MEDICATIONS: Reviewed per [facility]  REVIEW OF SYSTEMS: Unobtainable due to patient being a poor historian  PHYSICAL EXAMINATION  VS: see vital sign section  GENERAL: no acute distress, obese body habitus EYES: Normal sclerae, normal conjunctivae, no discharge NECK: supple, trachea midline, no neck masses, no thyroid tenderness, no thyromegaly LYMPHATICS: No cervical lymphadenopathy, no supraclavicular lymphadenopathy RESPIRATORY: breathing is even & unlabored, BS CTAB CARDIAC: RRR, no murmur,no extra heart sounds, no edema GI: abdomen soft, normal BS, no masses, no tenderness, no hepatomegaly, no splenomegaly PSYCHIATRIC: the patient is alert & oriented to person, affect & behavior appropriate  LABS/RADIOLOGY: 5-15 CBC normal, albumin 3.4 otherwise CMP normal 5/14 WBC 3.9, dobutamine 11.9, MCV 96.5, platelets 147, total protein 5.2, albumin 2.9 otherwise CMP normal  ASSESSMENT/PLAN:  Alzheimer's  dementia-stable. Peripheral neuropathy-denies ongoing symptoms. Osteoarthritis-pain well-controlled. Constipation-well controlled. Depression-continue Remeron.  CPT CODE: [id number]  Newton Pigg. Kerry Dory, MD [facility] [phone]

## 2014-07-06 ENCOUNTER — Other Ambulatory Visit: Payer: Self-pay

## 2014-07-06 MED ORDER — OXYCODONE-ACETAMINOPHEN 5-325 MG PO TABS: ORAL_TABLET | ORAL | Status: DC

## 2014-07-06 NOTE — Telephone Encounter (Signed)
Rx faxed to [facility] @ [phone], phone number [phone]  

## 2014-07-13 ENCOUNTER — Encounter: Payer: Self-pay | Admitting: Adult Health

## 2014-07-13 ENCOUNTER — Non-Acute Institutional Stay (SKILLED_NURSING_FACILITY): Payer: Medicare Other | Admitting: Adult Health

## 2014-07-13 DIAGNOSIS — M1990 Unspecified osteoarthritis, unspecified site: Secondary | ICD-10-CM

## 2014-07-13 DIAGNOSIS — F329 Major depressive disorder, single episode, unspecified: Secondary | ICD-10-CM

## 2014-07-13 DIAGNOSIS — Z7901 Long term (current) use of anticoagulants: Secondary | ICD-10-CM

## 2014-07-13 DIAGNOSIS — G309 Alzheimer's disease, unspecified: Principal | ICD-10-CM

## 2014-07-13 DIAGNOSIS — F0280 Dementia in other diseases classified elsewhere without behavioral disturbance: Secondary | ICD-10-CM

## 2014-07-13 DIAGNOSIS — F32A Depression, unspecified: Secondary | ICD-10-CM

## 2014-07-13 DIAGNOSIS — Z86718 Personal history of other venous thrombosis and embolism: Secondary | ICD-10-CM

## 2014-07-13 DIAGNOSIS — F3289 Other specified depressive episodes: Secondary | ICD-10-CM

## 2014-07-13 DIAGNOSIS — G609 Hereditary and idiopathic neuropathy, unspecified: Secondary | ICD-10-CM

## 2014-07-13 DIAGNOSIS — K5900 Constipation, unspecified: Secondary | ICD-10-CM

## 2014-07-13 NOTE — Progress Notes (Signed)
Patient ID: Jenna Barber, female   DOB: 04/04/1929, 80 y.o.   MRN: [id number]        PROGRESS NOTE  DATE: 07/13/14  FACILITY: Nursing Home [facility]  LEVEL OF CARE: SNF (31)  Routine Visit  CHIEF COMPLAINT:  Manage Alzheimer's dementia, osteoarthritis and peripheral neuropathy  HISTORY OF PRESENT ILLNESS:  REASSESSMENT OF ONGOING PROBLEM(S):  DEPRESSION: The depression remains stable. Patient denies ongoing feelings of sadness, insomnia, anedhonia or lack of appetite. No complications reported from the medications currently being used. Staff do not report behavioral problems.  DVT: The DVTs remains stable.  Patient denies calf pain, chest pain or shortness of breath.  Patient is currently on anti-coagulation and does not report any complications from that.  DEMENTIA: The dementia remaines stable and continues to function adequately in the current living environment with supervision.  The patient has had little changes in behavior. No complications noted from the medications presently being used.   PAST MEDICAL HISTORY : Reviewed.  No changes.  [facility]: [facility]  REVIEW OF SYSTEMS: Unobtainable due to patient being a poor historian  PHYSICAL EXAMINATION  GENERAL: no acute distress, obese body habitus NECK: supple, trachea midline, no neck masses, no thyroid tenderness, no thyromegaly RESPIRATORY: breathing is even & unlabored, BS CTAB CARDIAC: RRR, no murmur,no extra heart sounds, no edema GI: abdomen soft, normal BS, no masses, no tenderness, no hepatomegaly, no splenomegaly PSYCHIATRIC: the patient is alert & oriented to person, affect & behavior appropriate  LABS/RADIOLOGY: 5-15 CBC normal, albumin 3.4 otherwise CMP normal 5/14 WBC 3.9, dobutamine 11.9, MCV 96.5, platelets 147, total protein 5.2, albumin 2.9 otherwise CMP normal  ASSESSMENT/PLAN:  Alzheimer's dementia-stable; continue Namenda and Aricept Peripheral  neuropathy-denies ongoing symptoms; continue Neurontin Osteoarthritis-pain well-controlled; continue Percocet Constipation-well controlled; continue Colace, Senokot-S and Miralax Depression-continue Remeron. LLE DVT - stable; continue Coumadin Long-term use of anticoagulant - INR 1.7- subtherapeutic; increase Coumadin 5 mg PO Q D; INR 07/15/14   CPT CODE: [id number]  Jenna Barber - NP [facility] [phone]

## 2014-07-28 ENCOUNTER — Non-Acute Institutional Stay (SKILLED_NURSING_FACILITY): Payer: Medicare Other | Admitting: Adult Health

## 2014-07-28 ENCOUNTER — Encounter: Payer: Self-pay | Admitting: Adult Health

## 2014-07-28 DIAGNOSIS — Z86718 Personal history of other venous thrombosis and embolism: Secondary | ICD-10-CM

## 2014-07-28 DIAGNOSIS — Z7901 Long term (current) use of anticoagulants: Secondary | ICD-10-CM

## 2014-07-28 NOTE — Progress Notes (Signed)
Patient ID: Alto DenverLois S Beckerman, female   DOB: 04/16/1929, 80 y.o.   MRN: [id number]    Subjective:     Indication: DVT Bleeding signs/symptoms: None Thromboembolic signs/symptoms: None  Missed Coumadin doses: None Medication changes: no Dietary changes: no Bacterial/viral infection: no Other concerns: no   Review of Systems A comprehensive review of systems was negative.   Objective:    INR Today: 1.7 Current dose: Coumadin 5mg  by mouth daily  Assessment:   subtherapeutic INR for goal of  2-3.5  Plan:    1. New dose:   Increase Coumadin to 5.5 mg PO Q D   2. Next INR: 07/30/14

## 2014-08-03 ENCOUNTER — Encounter: Payer: Self-pay | Admitting: Adult Health

## 2014-08-03 ENCOUNTER — Non-Acute Institutional Stay (SKILLED_NURSING_FACILITY): Payer: Medicare Other | Admitting: Adult Health

## 2014-08-03 DIAGNOSIS — F329 Major depressive disorder, single episode, unspecified: Secondary | ICD-10-CM

## 2014-08-03 DIAGNOSIS — F0280 Dementia in other diseases classified elsewhere without behavioral disturbance: Secondary | ICD-10-CM

## 2014-08-03 DIAGNOSIS — G629 Polyneuropathy, unspecified: Secondary | ICD-10-CM

## 2014-08-03 DIAGNOSIS — G589 Mononeuropathy, unspecified: Secondary | ICD-10-CM

## 2014-08-03 DIAGNOSIS — F3289 Other specified depressive episodes: Secondary | ICD-10-CM

## 2014-08-03 DIAGNOSIS — K5900 Constipation, unspecified: Secondary | ICD-10-CM

## 2014-08-03 DIAGNOSIS — Z86718 Personal history of other venous thrombosis and embolism: Secondary | ICD-10-CM

## 2014-08-03 DIAGNOSIS — F32A Depression, unspecified: Secondary | ICD-10-CM

## 2014-08-03 DIAGNOSIS — G309 Alzheimer's disease, unspecified: Secondary | ICD-10-CM

## 2014-08-03 DIAGNOSIS — M1990 Unspecified osteoarthritis, unspecified site: Secondary | ICD-10-CM

## 2014-08-03 NOTE — Progress Notes (Signed)
Patient ID: Alto DenverLois S Mensing, female   DOB: 07/02/1929, 80 y.o.   MRN: [id number]        PROGRESS NOTE  DATE:    08/03/14  FACILITY: Nursing Home [facility]  LEVEL OF CARE: SNF (31)  Routine Visit  CHIEF COMPLAINT:  Manage Alzheimer's dementia, osteoarthritis and peripheral neuropathy  HISTORY OF PRESENT ILLNESS:  REASSESSMENT OF ONGOING PROBLEM(S):  PERIPHERAL NEUROPATHY: The peripheral neuropathy is stable. The patient denies pain in the feet, tingling, and numbness. No complications noted from the medication presently being used.  CONSTIPATION: The constipation remains stable. No complications from the medications presently being used. Patient denies ongoing constipation, abdominal pain, nausea or vomiting.  DEPRESSION: The depression remains stable. Patient denies ongoing feelings of sadness, insomnia, anedhonia or lack of appetite. No complications reported from the medications currently being used. Staff do not report behavioral problems.  PAST MEDICAL HISTORY : Reviewed.  No changes.  CURRENT MEDICATIONS: [facility]  REVIEW OF SYSTEMS: Unobtainable due to patient being a poor historian  PHYSICAL EXAMINATION  GENERAL: no acute distress, obese body habitus NECK: supple, trachea midline, no neck masses, no thyroid tenderness, no thyromegaly RESPIRATORY: breathing is even & unlabored, BS CTAB CARDIAC: RRR, no murmur,no extra heart sounds, no edema GI: abdomen soft, normal BS, no masses, no tenderness, no hepatomegaly, no splenomegaly EXTREMITIES: able to move all 4 extremities but has generalized weakness of BLE; uses SoftPro AFO BLE to prevent contractures PSYCHIATRIC: the patient is alert & oriented to person, affect & behavior appropriate  LABS/RADIOLOGY: 5-15 CBC normal, albumin 3.4 otherwise CMP normal 5/14 WBC 3.9, dobutamine 11.9, MCV 96.5, platelets 147, total protein 5.2, albumin 2.9 otherwise CMP  normal  ASSESSMENT/PLAN:  Alzheimer's dementia  -  stable; continue Namenda and Aricept Peripheral neuropathy  -  denies ongoing symptoms; continue Neurontin Osteoarthritis  -  pain well - controlled; continue Percocet Constipation  -  well controlled; continue Colace, Senokot-S and Miralax Depression  -  continue Remeron. LLE DVT - stable; continue Coumadin   CPT CODE: [id number]  Ella BodoMonina Vargas - NP [facility] [phone]

## 2014-08-04 ENCOUNTER — Other Ambulatory Visit: Payer: Self-pay | Admitting: *Deleted

## 2014-08-04 MED ORDER — OXYCODONE-ACETAMINOPHEN 5-325 MG PO TABS: ORAL_TABLET | ORAL | Status: DC

## 2014-08-04 NOTE — Telephone Encounter (Signed)
[facility] 

## 2014-09-02 ENCOUNTER — Other Ambulatory Visit: Payer: Self-pay | Admitting: *Deleted

## 2014-09-02 MED ORDER — OXYCODONE-ACETAMINOPHEN 5-325 MG PO TABS: ORAL_TABLET | ORAL | Status: DC

## 2014-09-02 NOTE — Telephone Encounter (Signed)
[facility] 

## 2014-09-10 ENCOUNTER — Encounter: Payer: Self-pay | Admitting: Adult Health

## 2014-09-10 ENCOUNTER — Non-Acute Institutional Stay (SKILLED_NURSING_FACILITY): Payer: Medicare Other | Admitting: Adult Health

## 2014-09-10 DIAGNOSIS — F329 Major depressive disorder, single episode, unspecified: Secondary | ICD-10-CM

## 2014-09-10 DIAGNOSIS — F32A Depression, unspecified: Secondary | ICD-10-CM

## 2014-09-10 DIAGNOSIS — K5900 Constipation, unspecified: Secondary | ICD-10-CM

## 2014-09-10 DIAGNOSIS — G629 Polyneuropathy, unspecified: Secondary | ICD-10-CM

## 2014-09-10 DIAGNOSIS — Z7901 Long term (current) use of anticoagulants: Secondary | ICD-10-CM

## 2014-09-10 DIAGNOSIS — F0280 Dementia in other diseases classified elsewhere without behavioral disturbance: Secondary | ICD-10-CM

## 2014-09-10 DIAGNOSIS — M1990 Unspecified osteoarthritis, unspecified site: Secondary | ICD-10-CM

## 2014-09-10 DIAGNOSIS — Z86718 Personal history of other venous thrombosis and embolism: Secondary | ICD-10-CM

## 2014-09-10 DIAGNOSIS — G309 Alzheimer's disease, unspecified: Secondary | ICD-10-CM

## 2014-09-10 DIAGNOSIS — G589 Mononeuropathy, unspecified: Secondary | ICD-10-CM

## 2014-09-10 DIAGNOSIS — F3289 Other specified depressive episodes: Secondary | ICD-10-CM

## 2014-09-10 NOTE — Progress Notes (Signed)
Patient ID: Jenna Barber, female   DOB: 12-27-1929, 80 y.o.   MRN: [id number]        PROGRESS NOTE  DATE:    09/10/14  FACILITY: Nursing Home Location: [facility]  LEVEL OF CARE: SNF (31)  Routine Visit  CHIEF COMPLAINT:  Manage Alzheimer's dementia, osteoarthritis and peripheral neuropathy  HISTORY OF PRESENT ILLNESS:  REASSESSMENT OF ONGOING PROBLEM(S):  DEMENTIA: The dementia remaines stable and continues to function adequately in the current living environment with supervision.  The patient has had little changes in behavior. No complications noted from the medications presently being used.  DVT: The DVTs remains stable.  Patient denies calf pain, chest pain or shortness of breath.  Patient is currently on anti-coagulation and does not report any complications from that.  PERIPHERAL NEUROPATHY: The peripheral neuropathy is stable. The patient denies pain in the feet, tingling, and numbness. No complications noted from the medication presently being used.  PAST MEDICAL HISTORY : Reviewed.  No changes.  CURRENT MEDICATIONS: Reviewed per [facility]  REVIEW OF SYSTEMS: Unobtainable due to patient being a poor historian  PHYSICAL EXAMINATION  GENERAL: no acute distress, obese body habitus EYES:  Lids open and close normally. No blepharitis, entropion or ectropion NECK: supple, trachea midline, no neck masses, no thyroid tenderness, no thyromegaly RESPIRATORY: breathing is even & unlabored, BS CTAB CARDIAC: RRR, no murmur,no extra heart sounds, no edema GI: abdomen soft, normal BS, no masses, no tenderness, no hepatomegaly, no splenomegaly EXTREMITIES: able to move all 4 extremities but has generalized weakness of BLE; uses SoftPro AFO BLE to prevent contractures PSYCHIATRIC: the patient is alert & oriented to person, affect & behavior appropriate  LABS/RADIOLOGY: 5-15 CBC normal, albumin 3.4 otherwise CMP normal 5/14 WBC 3.9, dobutamine 11.9, MCV 96.5, platelets 147,  total protein 5.2, albumin 2.9 otherwise CMP normal  ASSESSMENT/PLAN:  Alzheimer's dementia  -  stable; continue Namenda and Aricept Peripheral neuropathy  -  denies ongoing symptoms; continue Neurontin Osteoarthritis  -  pain well - controlled; continue Percocet Constipation  -  well controlled; continue Colace, Senokot-S and Miralax Depression  -  continue Remeron. LLE DVT - stable; continue Coumadin Long term use of anticoagulant - INR 2.6 - therapeutic; continue Coumadin 5 mg PO Q D and check INR 09/14/14   CPT CODE: 82956  [name] - NP [facility] [phone]

## 2014-10-05 ENCOUNTER — Other Ambulatory Visit: Payer: Self-pay | Admitting: *Deleted

## 2014-10-05 MED ORDER — OXYCODONE-ACETAMINOPHEN 5-325 MG PO TABS: ORAL_TABLET | ORAL | Status: DC

## 2014-11-02 ENCOUNTER — Other Ambulatory Visit: Payer: Self-pay | Admitting: *Deleted

## 2014-11-02 MED ORDER — OXYCODONE-ACETAMINOPHEN 5-325 MG PO TABS
ORAL_TABLET | ORAL | Status: DC
Start: 2014-11-02 — End: 2014-12-03

## 2014-11-02 NOTE — Telephone Encounter (Signed)
[facility] 

## 2014-11-04 ENCOUNTER — Non-Acute Institutional Stay (SKILLED_NURSING_FACILITY): Payer: Medicare Other | Admitting: Adult Health

## 2014-11-04 ENCOUNTER — Encounter: Payer: Self-pay | Admitting: Adult Health

## 2014-11-04 DIAGNOSIS — G309 Alzheimer's disease, unspecified: Secondary | ICD-10-CM

## 2014-11-04 DIAGNOSIS — F0280 Dementia in other diseases classified elsewhere without behavioral disturbance: Secondary | ICD-10-CM

## 2014-11-04 DIAGNOSIS — Z7901 Long term (current) use of anticoagulants: Secondary | ICD-10-CM

## 2014-11-04 DIAGNOSIS — Z86718 Personal history of other venous thrombosis and embolism: Secondary | ICD-10-CM

## 2014-11-04 DIAGNOSIS — F329 Major depressive disorder, single episode, unspecified: Secondary | ICD-10-CM

## 2014-11-04 DIAGNOSIS — F32A Depression, unspecified: Secondary | ICD-10-CM

## 2014-11-04 DIAGNOSIS — G629 Polyneuropathy, unspecified: Secondary | ICD-10-CM

## 2014-11-04 DIAGNOSIS — K5900 Constipation, unspecified: Secondary | ICD-10-CM

## 2014-11-04 NOTE — Progress Notes (Signed)
Patient ID: Jenna Barber, female   DOB: 03/07/1929, 80 y.o.   MRN: [id number]   11/04/2014  Facility:  Nursing Home Location:  [facility] Room Number: 1001-1 LEVEL OF CARE:  SNF (31)  Routine Visit  Chief Complaint  Patient presents with  . Medical Management of Chronic Issues    Alzheimer's Dementia, History of DVT, Peripheral Neuropathy,, Osteoarthrosis, Constipation, Depression and Long-term use of anticoagulant    HISTORY OF PRESENT ILLNESS:  REASSESSMENT OF ONGOING PROBLEMS:  DEPRESSION: The depression remains stable. Patient denies ongoing feelings of sadness, insomnia, anedhonia or lack of appetite. No complications reported from the medications currently being used. Staff do not report behavioral problems.  DEMENTIA: The dementia remaines stable and continues to function adequately in the current living environment with supervision.  The patient has had little changes in behavior. No complications noted from the medications presently being used.  CONSTIPATION: The constipation remains stable. No complications from the medications presently being used. Patient denies ongoing constipation, abdominal pain, nausea or vomiting.   PAST MEDICAL HISTORY:  Past Medical History  Diagnosis Date  . Alzheimer's dementia   . Parkinson disease   . Depression   . Hypertension   . Arthritis   . Hyperlipemia   . Peripheral neuropathy   . Back pain   . Gout   . Constipation   . Hearing loss   . Insomnia     CURRENT MEDICATIONS: Reviewed per MAR/see medication list  Allergies  Allergen Reactions  . Bextra [Valdecoxib]   . Clindamycin/Lincomycin   . Codeine   . Diclofenac   . Methadone   . Mobic [Meloxicam]   . Naproxen   . Penicillins   . Sulfa Antibiotics      REVIEW OF SYSTEMS:  GENERAL: no change in appetite, no fatigue, no weight changes, no fever, chills or weakness RESPIRATORY: no cough, SOB, DOE, wheezing, hemoptysis CARDIAC: no  chest pain, edema or palpitations GI: no abdominal pain, diarrhea, constipation, heart burn, nausea or vomiting  PHYSICAL EXAMINATION  GENERAL: no acute distress, normal body habitus EYES: conjunctivae normal, sclerae normal, normal eye lids NECK: supple, trachea midline, no neck masses, no thyroid tenderness, no thyromegaly LYMPHATICS: no LAN in the neck, no supraclavicular LAN RESPIRATORY: breathing is even & unlabored, BS CTAB CARDIAC: RRR, no murmur,no extra heart sounds, no edema GI: abdomen soft, normal BS, no masses, no tenderness, no hepatomegaly, no splenomegaly EXTREMITIES: able to move all 4 extremities; wear PSYCHIATRIC: the patient is alert & oriented to person, affect & behavior appropriate  LABS/RADIOLOGY:  5-15 CBC normal, albumin 3.4 otherwise CMP normal 5/14 WBC 3.9, dobutamine 11.9, MCV 96.5, platelets 147, total protein 5.2, albumin 2.9 otherwise CMP normal  ASSESSMENT/PLAN:  Alzheimer's dementia  -  stable; continue Namenda and Aricept Peripheral neuropathy  -  denies ongoing symptoms; continue Neurontin Osteoarthritis  -  pain well - controlled; continue Percocet Constipation  -  well controlled; continue Colace, Senokot-S and Miralax Depression  -  continue Remeron. Hx of LLE DVT - stable; continue Coumadin Long term use of anticoagulant - INR 3.1 - Coumadin was held X 1 day due to supratherapeutic INR; decrease Coumadin to 4.5 mg PO Q D and check INR 11/05/14 Check CBC and CMP     CPT CODE: [id number]    [facility],Brantly Kalman, NP [facility] [phone]

## 2014-12-03 ENCOUNTER — Other Ambulatory Visit: Payer: Self-pay | Admitting: *Deleted

## 2014-12-03 MED ORDER — OXYCODONE-ACETAMINOPHEN 5-325 MG PO TABS: ORAL_TABLET | ORAL | Status: AC

## 2014-12-03 NOTE — Telephone Encounter (Signed)
[facility] 

## 2014-12-31 DEATH — deceased
